# Patient Record
Sex: Female | Born: 1965 | Race: White | Hispanic: No | Marital: Married | Smoking: Former smoker
Health system: Southern US, Community
[De-identification: ages and names within clinical notes are randomized; demographics above are authoritative.]

## PROBLEM LIST (undated history)

## (undated) DIAGNOSIS — F329 Major depressive disorder, single episode, unspecified: Secondary | ICD-10-CM

## (undated) DIAGNOSIS — I10 Essential (primary) hypertension: Secondary | ICD-10-CM

## (undated) DIAGNOSIS — Z9889 Other specified postprocedural states: Secondary | ICD-10-CM

## (undated) DIAGNOSIS — F419 Anxiety disorder, unspecified: Secondary | ICD-10-CM

## (undated) DIAGNOSIS — F32A Depression, unspecified: Secondary | ICD-10-CM

## (undated) DIAGNOSIS — Z87442 Personal history of urinary calculi: Secondary | ICD-10-CM

## (undated) DIAGNOSIS — R519 Headache, unspecified: Secondary | ICD-10-CM

## (undated) DIAGNOSIS — Z8489 Family history of other specified conditions: Secondary | ICD-10-CM

## (undated) DIAGNOSIS — R51 Headache: Secondary | ICD-10-CM

## (undated) DIAGNOSIS — J309 Allergic rhinitis, unspecified: Secondary | ICD-10-CM

## (undated) DIAGNOSIS — G4700 Insomnia, unspecified: Secondary | ICD-10-CM

## (undated) DIAGNOSIS — R112 Nausea with vomiting, unspecified: Secondary | ICD-10-CM

## (undated) HISTORY — PX: ABDOMINAL HYSTERECTOMY: SHX81

## (undated) HISTORY — PX: DIAGNOSTIC LAPAROSCOPY: SUR761

---

## 1988-06-19 HISTORY — PX: REDUCTION MAMMAPLASTY: SUR839

## 2008-06-03 ENCOUNTER — Emergency Department: Payer: Self-pay | Admitting: Emergency Medicine

## 2008-06-05 ENCOUNTER — Ambulatory Visit: Payer: Self-pay | Admitting: Specialist

## 2009-05-15 ENCOUNTER — Emergency Department: Payer: Self-pay | Admitting: Emergency Medicine

## 2011-02-10 ENCOUNTER — Emergency Department: Payer: Self-pay | Admitting: Emergency Medicine

## 2012-10-01 ENCOUNTER — Ambulatory Visit: Payer: Self-pay | Admitting: Internal Medicine

## 2013-01-15 ENCOUNTER — Ambulatory Visit: Payer: Self-pay | Admitting: Neurology

## 2013-06-30 ENCOUNTER — Ambulatory Visit: Payer: Self-pay | Admitting: Obstetrics and Gynecology

## 2013-06-30 LAB — BASIC METABOLIC PANEL
Anion Gap: 4 — ABNORMAL LOW (ref 7–16)
BUN: 17 mg/dL (ref 7–18)
Calcium, Total: 9.4 mg/dL (ref 8.5–10.1)
Chloride: 103 mmol/L (ref 98–107)
Co2: 30 mmol/L (ref 21–32)
Creatinine: 0.72 mg/dL (ref 0.60–1.30)
EGFR (African American): >60
EGFR (Non-African Amer.): >60
GLUCOSE: 78 mg/dL (ref 65–99)
OSMOLALITY: 274 (ref 275–301)
Potassium: 4.2 mmol/L (ref 3.5–5.1)
Sodium: 137 mmol/L (ref 136–145)

## 2013-06-30 LAB — HEMOGLOBIN: HGB: 13 g/dL (ref 12.0–16.0)

## 2013-06-30 LAB — PREGNANCY, URINE: PREGNANCY TEST, URINE: NEGATIVE m[IU]/mL

## 2013-07-02 ENCOUNTER — Inpatient Hospital Stay: Payer: Self-pay | Admitting: Obstetrics and Gynecology

## 2013-07-03 LAB — HEMATOCRIT: HCT: 36 % (ref 35.0–47.0)

## 2013-07-03 LAB — BASIC METABOLIC PANEL
ANION GAP: 6 — AB (ref 7–16)
BUN: 15 mg/dL (ref 7–18)
CALCIUM: 8.5 mg/dL (ref 8.5–10.1)
CO2: 25 mmol/L (ref 21–32)
Chloride: 102 mmol/L (ref 98–107)
Creatinine: 0.67 mg/dL (ref 0.60–1.30)
EGFR (Non-African Amer.): >60
Glucose: 121 mg/dL — ABNORMAL HIGH (ref 65–99)
Osmolality: 268 (ref 275–301)
Potassium: 3.7 mmol/L (ref 3.5–5.1)
Sodium: 133 mmol/L — ABNORMAL LOW (ref 136–145)

## 2013-07-04 LAB — PATHOLOGY REPORT

## 2014-06-23 ENCOUNTER — Observation Stay: Payer: Self-pay | Admitting: Internal Medicine

## 2014-06-23 LAB — TROPONIN I: Troponin-I: <0.02 ng/mL

## 2014-06-23 LAB — CK TOTAL AND CKMB (NOT AT ARMC)
CK, TOTAL: 33 U/L (ref 26–192)
CK, TOTAL: 31 U/L (ref 26–192)
CK, Total: 46 U/L (ref 26–192)
CK-MB: 0.9 ng/mL (ref 0.5–3.6)
CK-MB: 0.9 ng/mL (ref 0.5–3.6)
CK-MB: 1.1 ng/mL (ref 0.5–3.6)

## 2014-06-23 LAB — COMPREHENSIVE METABOLIC PANEL
ALBUMIN: 3.5 g/dL (ref 3.4–5.0)
ALT: 26 U/L
AST: 17 U/L (ref 15–37)
Alkaline Phosphatase: 63 U/L
Anion Gap: 9 (ref 7–16)
BUN: 14 mg/dL (ref 7–18)
Bilirubin,Total: 0.3 mg/dL (ref 0.2–1.0)
CO2: 27 mmol/L (ref 21–32)
Calcium, Total: 9.1 mg/dL (ref 8.5–10.1)
Chloride: 100 mmol/L (ref 98–107)
Creatinine: 0.89 mg/dL (ref 0.60–1.30)
EGFR (African American): >60
EGFR (Non-African Amer.): >60
GLUCOSE: 91 mg/dL (ref 65–99)
Osmolality: 272 (ref 275–301)
Potassium: 4 mmol/L (ref 3.5–5.1)
SODIUM: 136 mmol/L (ref 136–145)
Total Protein: 7.2 g/dL (ref 6.4–8.2)

## 2014-06-23 LAB — CBC
HCT: 42.2 % (ref 35.0–47.0)
HGB: 14.1 g/dL (ref 12.0–16.0)
MCH: 29.1 pg (ref 26.0–34.0)
MCHC: 33.5 g/dL (ref 32.0–36.0)
MCV: 87 fL (ref 80–100)
Platelet: 340 10*3/uL (ref 150–440)
RBC: 4.86 10*6/uL (ref 3.80–5.20)
RDW: 13.2 % (ref 11.5–14.5)
WBC: 7.6 10*3/uL (ref 3.6–11.0)

## 2014-06-23 LAB — TSH: Thyroid Stimulating Horm: 0.921 u[IU]/mL

## 2014-06-23 LAB — PRO B NATRIURETIC PEPTIDE: B-Type Natriuretic Peptide: 51 pg/mL (ref 0–125)

## 2014-06-24 LAB — CBC WITH DIFFERENTIAL/PLATELET
Basophil #: 0.1 10*3/uL (ref 0.0–0.1)
Basophil %: 1 %
EOS ABS: 0.2 10*3/uL (ref 0.0–0.7)
Eosinophil %: 3.5 %
HCT: 37.8 % (ref 35.0–47.0)
HGB: 12.4 g/dL (ref 12.0–16.0)
LYMPHS PCT: 47.4 %
Lymphocyte #: 3 10*3/uL (ref 1.0–3.6)
MCH: 28.9 pg (ref 26.0–34.0)
MCHC: 32.7 g/dL (ref 32.0–36.0)
MCV: 88 fL (ref 80–100)
Monocyte #: 0.5 x10 3/mm (ref 0.2–0.9)
Monocyte %: 7.8 %
NEUTROS PCT: 40.3 %
Neutrophil #: 2.6 10*3/uL (ref 1.4–6.5)
Platelet: 286 10*3/uL (ref 150–440)
RBC: 4.28 10*6/uL (ref 3.80–5.20)
RDW: 13.5 % (ref 11.5–14.5)
WBC: 6.4 10*3/uL (ref 3.6–11.0)

## 2014-06-24 LAB — BASIC METABOLIC PANEL
ANION GAP: 6 — AB (ref 7–16)
BUN: 20 mg/dL — AB (ref 7–18)
CALCIUM: 8.4 mg/dL — AB (ref 8.5–10.1)
Chloride: 106 mmol/L (ref 98–107)
Co2: 25 mmol/L (ref 21–32)
Creatinine: 0.92 mg/dL (ref 0.60–1.30)
EGFR (African American): >60
EGFR (Non-African Amer.): >60
GLUCOSE: 92 mg/dL (ref 65–99)
OSMOLALITY: 276 (ref 275–301)
POTASSIUM: 3.8 mmol/L (ref 3.5–5.1)
Sodium: 137 mmol/L (ref 136–145)

## 2014-06-24 LAB — MAGNESIUM: MAGNESIUM: 1.9 mg/dL

## 2014-10-10 NOTE — Op Note (Signed)
PATIENT NAME:  Dana Stevens, Dana Stevens MR#:  [id number] DATE OF BIRTH:  [birth year]  DATE OF PROCEDURE:  07/02/2013  PREOPERATIVE DIAGNOSIS: Menorrhagia unresponsive to medical management.   POSTOPERATIVE DIAGNOSIS: Menorrhagia unresponsive to medical management.   PROCEDURE:  1.  Abdominal supracervical hysterectomy.  2.  Bilateral salpingectomies.   ANESTHESIA: General endotracheal anesthesia.  SURGEON: Boykin Nearing, M.D.   FIRST ASSISTANT: Dr. Amalia Hailey.   INDICATIONS: This is a 49 year old gravida 1, para 0 patient with a several month history of  menorrhagia unresponsive to medical treatment. The patient has elected for permanent surgical intervention   PROCEDURE: After adequate general endotracheal anesthesia, the patient was placed in the dorsal supine position, legs were in the [place] stirrups and SCDs were on the lower extremities. The patient received 2 grams IV cefoxitin prior to commencement of the case. The patient's abdomen and vagina were prepped and draped in normal sterile fashion. A Pfannenstiel incision was made 2 fingerbreadths above the symphysis pubis. Sharp dissection was used to identify the fascia. The patient had approximately 7 cm of subcutaneous fat before gaining entrance into the fascia. The fascia was opened in a transverse fashion and the superior aspect of the fascia was grasped with Kocher clamps and the recti muscles were dissected free. The inferior aspect of the fascia was grasped with Kocher clamps and the pyramidalis muscle was dissected free. Entry into the peritoneal cavity was accomplished sharply. O'Connor-O'Sullivan retractor was initially placed in the abdomen, but limited visibility based on the patient's body habitus required the Bookwalter retractor be brought up to the operative field. Ultimately, multiple retractors were used to gain visualization to the uterus. A small uterus was identified. Two large Kelly clamps were placed on the cornua of the uterus.  The round ligaments were bilaterally clamped, transected, and suture ligated with 0 Vicryl suture. A  window in the broad ligament was opened on both sides and Heaney clamps were used to clamp the utero-ovarian ligament. They were transected and suture ligated with 0 Vicryl suture.   The peritoneal fold over the bladder was opened and the bladder was reflected inferiorly. The uterine arteries were skeletonized and bilaterally clamped, transected, and suture ligated with 0 Vicryl suture. Given the poor visualization based on the patient's body habitus, it was elected at this point, not to try to proceed on with removal of the cervix. Therefore, a supracervical incision was made with removal of the uterus. The endocervical canal was then cauterized with the Bovie. Good hemostasis was noted. Each fallopian tube was grasped with a Babcock clamp and each fallopian tube was clamped with a Heaney clamp and removed and suture ligated with good hemostasis noted. The patient's abdomen was copiously irrigated. Good hemostasis was noted. All sponges were removed and retractors were removed. Fascia was closed with 0 Vicryl suture in a running nonlocking fashion with good approximation of edges. Subcutaneous tissues were reapproximated with 2-0 chromic suture given the depth of the subcutaneous tissue and the skin was reapproximated with staples. There were no complications.   ESTIMATED BLOOD LOSS: 150 mL.  INTRAOPERATIVE FLUIDS: 1300 mL.  URINE OUTPUT: 200 mL.  The patient tolerated the procedure well and was taken to the recovery room in good condition.   ____________________________ Boykin Nearing, MD tjs:aw D: 07/02/2013 09:55:22 ET T: 07/02/2013 10:01:57 ET JOB#: [id number]  cc: Boykin Nearing, MD, <Dictator> Boykin Nearing MD ELECTRONICALLY SIGNED 07/04/2013 8:37

## 2014-10-18 NOTE — Discharge Summary (Signed)
PATIENT NAME:  Dana Stevens, Dana Stevens MR#:  [id number] DATE OF BIRTH:  1966-05-26  DATE OF ADMISSION:  06/23/2014 DATE OF DISCHARGE:  06/24/2014  DISCHARGE DIAGNOSES:  1. Sinus bradycardia due to propranolol.  2. Migraine.  3. Hypertension.  4. Anxiety.   DISCHARGE MEDICATIONS:  1. Sudafed 30 mg 2 tablets every 6 hours as needed.  2. Advil 200 mg 2 tablets every 6 hours as needed.  3. Tylenol 325 mg oral every 4 hours as needed.  4. Zyrtec 10 mg oral once a day. 5. Flonase 1 spray nasal once a day.  6. Prilosec over-the-counter 20 mg daily.  7. Clonazepam 2 mg oral at bedtime.  8. Lisinopril 10 mg daily.  9. Hydrochlorothiazide 25 mg daily.  10. Meclizine 25 mg oral 2 times a day as needed.  11. Fluoxetine 20 mg daily.  12. Imitrex 50 mg oral once a day as needed for migraine.  13. Topamax 25 mg oral once a day for 1 week and then twice a day.   DISCHARGE INSTRUCTIONS: Low-sodium diet. Activity as tolerated. Follow up with Dr. Manuella Ghazi of neurology in 1 to 2 weeks for migraines.   IMAGING STUDIES: Include: 1. Echocardiogram which showed ejection fraction 55 to 60%, mild LVH, nothing acute.  2. Carotid Doppler showed no stenosis.   Chest x-ray normal.   ADMITTING HISTORY AND PHYSICAL AND HOSPITAL COURSE: Please see detailed H and P dictated previously by Dr. Tressia Miners.  In brief, a 49 year old patient with history of migraines on prophylactic propranolol for migraine and hypertension presented to the hospital complaining of 3 episodes of syncope, was found to have a heart rate in the 30s. EKG showed normal PR.  The patient's propranolol has been stopped during the hospital stay. Cardiac enzymes normal. Echocardiogram normal.  The patient's heart rate is presently between 55 to 65. The patient feels back to baseline and is being discharged back home after stopping the propranolol.  For her migraines, the patient is being started on Topamax low dose 25 mg which will be doubled to twice a day in 1  week and she will follow up with neurology as outpatient for her migraines.    Prior to discharge, the patient's lungs sound clear. S1, S2 heard. No bradycardia. No edema.   TIME SPENT ON DAY OF DISCHARGE IN DISCHARGE ACTIVITY: 45 minutes.     ____________________________ Dana Alf Anira Senegal, MD srs:by D: 06/25/2014 12:42:15 ET T: 06/25/2014 14:34:38 ET JOB#: [id number]  cc: Alveta Heimlich R. Adelin Ventrella, MD, <Dictator> Irven Easterly. Kary Kos, MD Hemang K. Manuella Ghazi, MD  Neita Carp MD ELECTRONICALLY SIGNED 07/03/2014 12:51

## 2014-10-18 NOTE — H&P (Signed)
PATIENT NAME:  Dana Stevens, Dana Stevens MR#:  [id number] DATE OF BIRTH:  Apr 14, 1966  DATE OF ADMISSION:  06/23/2014  ADMITTING PHYSICIAN: Gladstone Lighter, MD  PRIMARY CARE PHYSICIAN: Irven Easterly. Kary Kos, MD    CHIEF COMPLAINT: Passing out.   HISTORY OF PRESENT ILLNESS: Dana Stevens is an obese 49 year old pleasant Caucasian female with past medical history significant for hypertension, depression, anxiety, and migraine headaches, brought from work secondary to 3 weakness, passing out episodes with loss of consciousness. The patient states that she has been feeling fine when she woke up this morning. Around 8:30, she felt having cold sweats, dizziness and before she realized, she passed out. Did not hit her head. No weakness, seizure activity. She said she might have been out for only a few seconds and the same episode repeated about 2 more times. She did have some headaches and dizziness. No chest pain, but some difficulty breathing. In the Emergency Room, the patient had another episode of syncope for a few seconds. She was noted to be bradycardic in the high 30s. She is on propranolol for her migraine prophylaxis. She had been on 80 mg twice a day about a year ago, but she was changed over to 80 mg once a day about 10 months ago due to a syncopal episode. Denies any prior cardiac history or family history of heart disease present with grandmother with pacemaker. No prior syncopal episodes, other than the one in the remote past, when propranolol twice a day was started.   PAST MEDICAL HISTORY:  1.  Migraine headaches.  2.  Hypertension.  3.  Depression and anxiety.   PAST SURGICAL HISTORY:  1.  Breast reduction surgery.  2.  Abdominal hysterectomy and salpingectomy.   ALLERGIES TO MEDICATIONS:  1.  Cipro.  2.  Levaquin. 3.  Steroids.  4.  Peanuts. 5.  Latex. 6.  Smoke.  7.  Cologne and perfume.  8.  Adhesive material.   CURRENT MEDICATIONS: 1.  Propranolol 80 mg p.o. once a day at bedtime.  2.   Advil 200 mg p.o. q. 6 hours p.r.n.  3.  Klonopin 2 mg p.o. at bedtime.  4.  Flonase 50 mcg nasal spray once a day as needed.  5.  Fluoxetine 20 mg p.o. daily.  6.  Hydrochlorothiazide 25 mg p.o. daily.  7.  Lisinopril 10 mg p.o. daily.  8.  Meclizine 25 mg twice a day as needed.  9.  Prilosec over the counter 20 mg oral daily.  10.  Sudafed 30 mg 2 tablets q. 6 hours p.r.n. for recent sinus infection.  11.  Tylenol 650 mg q. 4 hours p.r.n. for pain or fever.  12.  Zyrtec 10 mg p.o. daily.   SOCIAL HISTORY: Lives at home with her husband. No smoking or alcohol use. Works as a Insurance claims handler.   FAMILY HISTORY: Both grandfathers with heart disease. Grandmother had pacemaker. Mom died from breast cancer.   REVIEW OF SYSTEMS:  CONSTITUTIONAL: No fever, fatigue, or weakness.  EYES: No blurred vision, double vision, inflammation, or glaucoma.  ENT: No tinnitus, ear pain, hearing loss, epistaxis or discharge.   RESPIRATORY: No cough, wheeze, hemoptysis, or COPD. CARDIOVASCULAR: No chest pain, orthopnea, edema, arrhythmia, or palpitations. Positive for syncope.  GASTROINTESTINAL: No nausea, vomiting, diarrhea, abdominal pain, hematemesis, or melena.  GENITOURINARY: No dysuria, hematuria, renal calculus, frequency, or incontinence.  ENDOCRINE: No polyuria, nocturia, thyroid problems, heat or cold intolerance.  HEMATOLOGY: No anemia, easy bruising, or bleeding.  SKIN: No acne,  rash or lesions. MUSCULOSKELETAL: No neck fracture repair, nodularities, or gout.  NEUROLOGIC: No numbness, weakness, CVA, TIA, or seizures.  PSYCHOLOGICAL: No anxiety, insomnia, or depression.   PHYSICAL EXAMINATION: VITAL SIGNS: Temperature 97.5 degrees Fahrenheit, pulse 51, respirations 12, blood pressure 133/68, pulse ox 97% on room air.  GENERAL: Heavily built, well-nourished female lying in bed, not in any acute distress.  HEENT: Normocephalic, atraumatic. Pupils equal, round, reacting to light. Anicteric  sclerae. Extraocular intact. Oropharynx is clear without erythema, mass, or exudates.  NECK: Supple. No thyromegaly, JVD, or carotid bruits. No lymphadenopathy.  LUNGS: Moving air bilaterally. No wheeze or crackles. No use of accessory muscles for breathing.  CARDIOVASCULAR: S1, S2, regular rate and rhythm. No murmurs, rubs, or gallops.  ABDOMEN: Obese, soft, nontender, nondistended, no hepatosplenomegaly, normal bowel sounds.  EXTREMITIES: No pedal edema. No clubbing or cyanosis, 1+ dorsalis pedis pulses palpable bilaterally.  SKIN: No acne, rash, or lesions.  LYMPHATICS: No cervical or inguinal lymphadenopathy.  NEUROLOGIC: Cranial nerves intact. No focal motor or sensory deficits.  PSYCHOLOGICAL: The patient is awake, alert, oriented x3.   LABORATORY DATA: WBC 7.6, hemoglobin 14.1, hematocrit 42.2, platelet count 340. Sodium 136, potassium 4.0, chloride 100, bicarbonate 27, BUN 14, creatinine 0.89, glucose of 91, calcium of 9.1. ALT 26, AST 17 alkaline phosphatase is 63, albumin of 3.3, total bilirubin 0.3. Troponin, the 1st one is negative. BNP is within normal limits at 51. Chest x-ray: Clear lung fields. No acute cardiopulmonary disease. EKG: Showing sinus bradycardia, heart rate of 44. No heart blocks noted.   ASSESSMENT AND PLAN: This is a 49 year old female with history of migraines, hypertension, depression, and anxiety, admitted for recurrent syncope.  1.  Syncope, could be secondary to bradycardia, has been sinus brady with no heart blocks. We will monitor on telemetry unit. Get an echo and cardiology consult. Get carotid Dopplers, as well. Hold propranolol, as she has been on that, which could be the culprit medication. Check TSH and, recycle cardiac markers.  2.  Hypertension. Continue her lisinopril. Follow head CT scan, propranolol.  3.  Depression and anxiety. Continue fluoxetine and Klonopin.  4.  Deep vein thrombosis prophylaxis.   CODE STATUS: Full code.   TIME SPENT ON  ADMISSION: 50 minutes.    ____________________________ Gladstone Lighter, MD rk:mw D: 06/23/2014 12:41:51 ET T: 06/23/2014 12:58:02 ET JOB#: [id number]  cc: Gladstone Lighter, MD, <Dictator> Irven Easterly. Kary Kos, MD Gladstone Lighter MD ELECTRONICALLY SIGNED 07/08/2014 13:25

## 2014-12-08 ENCOUNTER — Other Ambulatory Visit: Payer: Self-pay | Admitting: Family Medicine

## 2014-12-08 DIAGNOSIS — Z1231 Encounter for screening mammogram for malignant neoplasm of breast: Secondary | ICD-10-CM

## 2014-12-16 ENCOUNTER — Ambulatory Visit
Admission: RE | Admit: 2014-12-16 | Discharge: 2014-12-16 | Disposition: A | Discharge status: Home / Self Care | Payer: 59 | Source: Ambulatory Visit | Attending: Family Medicine | Admitting: Family Medicine

## 2014-12-16 DIAGNOSIS — Z1231 Encounter for screening mammogram for malignant neoplasm of breast: Secondary | ICD-10-CM | POA: Insufficient documentation

## 2016-06-30 ENCOUNTER — Other Ambulatory Visit: Payer: Self-pay | Admitting: Family Medicine

## 2016-06-30 DIAGNOSIS — Z1231 Encounter for screening mammogram for malignant neoplasm of breast: Secondary | ICD-10-CM

## 2016-08-03 ENCOUNTER — Ambulatory Visit
Admission: RE | Admit: 2016-08-03 | Discharge: 2016-08-03 | Disposition: A | Discharge status: Home / Self Care | Payer: BLUE CROSS/BLUE SHIELD | Source: Ambulatory Visit | Attending: Family Medicine | Admitting: Family Medicine

## 2016-08-03 DIAGNOSIS — Z1231 Encounter for screening mammogram for malignant neoplasm of breast: Secondary | ICD-10-CM | POA: Diagnosis present

## 2017-01-01 ENCOUNTER — Ambulatory Visit: Payer: BLUE CROSS/BLUE SHIELD | Admitting: Anesthesiology

## 2017-01-01 ENCOUNTER — Encounter
Admission: RE | Disposition: A | Discharge status: Home / Self Care | Payer: Self-pay | Source: Ambulatory Visit | Attending: Unknown Physician Specialty

## 2017-01-01 ENCOUNTER — Encounter: Payer: Self-pay | Admitting: *Deleted

## 2017-01-01 ENCOUNTER — Ambulatory Visit
Admission: RE | Admit: 2017-01-01 | Discharge: 2017-01-01 | Disposition: A | Discharge status: Home / Self Care | Payer: BLUE CROSS/BLUE SHIELD | Source: Ambulatory Visit | Attending: Unknown Physician Specialty | Admitting: Unknown Physician Specialty

## 2017-01-01 DIAGNOSIS — I10 Essential (primary) hypertension: Secondary | ICD-10-CM | POA: Diagnosis not present

## 2017-01-01 DIAGNOSIS — J309 Allergic rhinitis, unspecified: Secondary | ICD-10-CM | POA: Diagnosis not present

## 2017-01-01 DIAGNOSIS — Z7951 Long term (current) use of inhaled steroids: Secondary | ICD-10-CM | POA: Diagnosis not present

## 2017-01-01 DIAGNOSIS — F329 Major depressive disorder, single episode, unspecified: Secondary | ICD-10-CM | POA: Diagnosis not present

## 2017-01-01 DIAGNOSIS — Z1211 Encounter for screening for malignant neoplasm of colon: Secondary | ICD-10-CM | POA: Insufficient documentation

## 2017-01-01 DIAGNOSIS — Z87891 Personal history of nicotine dependence: Secondary | ICD-10-CM | POA: Insufficient documentation

## 2017-01-01 DIAGNOSIS — Z79899 Other long term (current) drug therapy: Secondary | ICD-10-CM | POA: Insufficient documentation

## 2017-01-01 DIAGNOSIS — K219 Gastro-esophageal reflux disease without esophagitis: Secondary | ICD-10-CM | POA: Diagnosis not present

## 2017-01-01 DIAGNOSIS — D125 Benign neoplasm of sigmoid colon: Secondary | ICD-10-CM | POA: Insufficient documentation

## 2017-01-01 DIAGNOSIS — K640 First degree hemorrhoids: Secondary | ICD-10-CM | POA: Diagnosis not present

## 2017-01-01 DIAGNOSIS — F419 Anxiety disorder, unspecified: Secondary | ICD-10-CM | POA: Insufficient documentation

## 2017-01-01 HISTORY — DX: Headache: R51

## 2017-01-01 HISTORY — DX: Family history of other specified conditions: Z84.89

## 2017-01-01 HISTORY — DX: Anxiety disorder, unspecified: F41.9

## 2017-01-01 HISTORY — DX: Other specified postprocedural states: Z98.890

## 2017-01-01 HISTORY — DX: Headache, unspecified: R51.9

## 2017-01-01 HISTORY — PX: COLONOSCOPY WITH PROPOFOL: SHX5780

## 2017-01-01 HISTORY — DX: Depression, unspecified: F32.A

## 2017-01-01 HISTORY — DX: Allergic rhinitis, unspecified: J30.9

## 2017-01-01 HISTORY — DX: Essential (primary) hypertension: I10

## 2017-01-01 HISTORY — DX: Nausea with vomiting, unspecified: R11.2

## 2017-01-01 HISTORY — DX: Insomnia, unspecified: G47.00

## 2017-01-01 HISTORY — DX: Major depressive disorder, single episode, unspecified: F32.9

## 2017-01-01 HISTORY — DX: Personal history of urinary calculi: Z87.442

## 2017-01-01 SURGERY — COLONOSCOPY WITH PROPOFOL
Anesthesia: General

## 2017-01-01 MED ORDER — PENTAFLUOROPROP-TETRAFLUOROETH EX AERO
INHALATION_SPRAY | CUTANEOUS | Status: AC
  Filled 2017-01-01: qty 30

## 2017-01-01 MED ORDER — LIDOCAINE 2% (20 MG/ML) 5 ML SYRINGE
INTRAMUSCULAR | Status: DC | PRN
  Administered 2017-01-01: 100 mg via INTRAVENOUS

## 2017-01-01 MED ORDER — SODIUM CHLORIDE 0.9 % IV SOLN: INTRAVENOUS | Status: DC

## 2017-01-01 MED ORDER — LIDOCAINE HCL (PF) 2 % IJ SOLN
INTRAMUSCULAR | Status: AC
  Filled 2017-01-01: qty 2

## 2017-01-01 MED ORDER — SODIUM CHLORIDE 0.9 % IV SOLN
INTRAVENOUS | Status: DC
  Administered 2017-01-01: 1000 mL via INTRAVENOUS

## 2017-01-01 MED ORDER — PROPOFOL 10 MG/ML IV BOLUS
INTRAVENOUS | Status: DC | PRN
  Administered 2017-01-01: 80 mg via INTRAVENOUS

## 2017-01-01 MED ORDER — PROPOFOL 500 MG/50ML IV EMUL
INTRAVENOUS | Status: DC | PRN
  Administered 2017-01-01: 200 ug/kg/min via INTRAVENOUS

## 2017-01-01 MED ORDER — PROPOFOL 500 MG/50ML IV EMUL
INTRAVENOUS | Status: AC
  Filled 2017-01-01: qty 50
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
## 2017-01-01 NOTE — Anesthesia Post-op Follow-up Note (Cosign Needed)
Anesthesia QCDR form completed.        
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
## 2017-01-01 NOTE — Op Note (Signed)
[facility] Patient Name: Dana Stevens Procedure Date: 01/01/2017 3:42 PM MRN: [id number] Account #: [id number] Date of Birth: 12/17/1965 Admit Type: Outpatient Age: 51 Room: [facility] ENDO ROOM 3 Gender: Female Note Status: Finalized Procedure:            Colonoscopy Indications:          Screening for colorectal malignant neoplasm Providers:            Manya Silvas, MD Referring MD:         Irven Easterly. Kary Kos, MD (Referring MD) Medicines:            Propofol per Anesthesia Complications:        No immediate complications. Procedure:            Pre-Anesthesia Assessment:                       - After reviewing the risks and benefits, the patient                        was deemed in satisfactory condition to undergo the                        procedure.                       After obtaining informed consent, the colonoscope was                        passed under direct vision. Throughout the procedure,                        the patient's blood pressure, pulse, and oxygen                        saturations were monitored continuously. The                        Colonoscope was introduced through the anus and                        advanced to the the cecum, identified by appendiceal                        orifice and ileocecal valve. The colonoscopy was                        performed without difficulty. The patient tolerated the                        procedure well. The quality of the bowel preparation                        was good. Findings:      A diminutive polyp was found in the distal sigmoid colon. The polyp was       sessile. The polyp was removed with a jumbo cold forceps. Resection and       retrieval were complete.      Internal hemorrhoids were found during endoscopy. The hemorrhoids were       small and Grade I (internal hemorrhoids that do not prolapse).      The  exam was otherwise without abnormality. Impression:           -  One diminutive polyp in the distal sigmoid colon,                        removed with a jumbo cold forceps. Resected and                        retrieved.                       - Internal hemorrhoids.                       - The examination was otherwise normal. Recommendation:       - Await pathology results. Procedure Code(s):    --- Professional ---                       [phone], Colonoscopy, flexible; with biopsy, single or                        multiple Diagnosis Code(s):    --- Professional ---                       Z12.11, Encounter for screening for malignant neoplasm                        of colon                       D12.5, Benign neoplasm of sigmoid colon                       K64.0, First degree hemorrhoids CPT copyright 2016 American Medical Association. All rights reserved. The codes documented in this report are preliminary and upon coder review may  be revised to meet current compliance requirements. Manya Silvas, MD 01/01/2017 4:04:33 PM This report has been signed electronically. Number of Addenda: 0 Note Initiated On: 01/01/2017 3:42 PM Scope Withdrawal Time: 0 hours 10 minutes 53 seconds  Total Procedure Duration: 0 hours 14 minutes 6 seconds       [facility]

## 2017-01-01 NOTE — Transfer of Care (Signed)
Immediate Anesthesia Transfer of Care Note  Patient: Dana Stevens  Procedure(s) Performed: Procedure(s): COLONOSCOPY WITH PROPOFOL (N/A)  Patient Location: Endoscopy Unit  Anesthesia Type:General  Level of Consciousness: awake, alert  and oriented  Airway & Oxygen Therapy: Patient connected to nasal cannula oxygen  Post-op Assessment: Post -op Vital signs reviewed and stable  Post vital signs: stable  Last Vitals:  Vitals:   01/01/17 1440 01/01/17 1609  BP: 111/66   Pulse: (!) 52 64  Resp: 18 19  Temp: 37.2 C     Last Pain:  Vitals:   01/01/17 1440  TempSrc: Tympanic  PainSc: 5       Patients Stated Pain Goal: 0 (92/17/83 7542)  Complications: No apparent anesthesia complications

## 2017-01-01 NOTE — Anesthesia Postprocedure Evaluation (Signed)
Anesthesia Post Note  Patient: Dana Stevens  Procedure(s) Performed: Procedure(s) (LRB): COLONOSCOPY WITH PROPOFOL (N/A)  Patient location during evaluation: Endoscopy Anesthesia Type: General Level of consciousness: awake and alert Pain management: pain level controlled Vital Signs Assessment: post-procedure vital signs reviewed and stable Respiratory status: spontaneous breathing, nonlabored ventilation, respiratory function stable and patient connected to nasal cannula oxygen Cardiovascular status: blood pressure returned to baseline and stable Postop Assessment: no signs of nausea or vomiting Anesthetic complications: no     Last Vitals:  Vitals:   01/01/17 1629 01/01/17 1639  BP: 129/76 132/84  Pulse: (!) 49 (!) 48  Resp: 18 14  Temp:      Last Pain:  Vitals:   01/01/17 1609  TempSrc: Tympanic  PainSc:                  [name]

## 2017-01-02 ENCOUNTER — Encounter: Payer: Self-pay | Admitting: Unknown Physician Specialty

## 2017-01-03 LAB — SURGICAL PATHOLOGY

## 2017-10-04 ENCOUNTER — Other Ambulatory Visit: Payer: Self-pay | Admitting: Family Medicine

## 2017-10-04 DIAGNOSIS — Z1231 Encounter for screening mammogram for malignant neoplasm of breast: Secondary | ICD-10-CM

## 2017-10-18 ENCOUNTER — Ambulatory Visit
Admission: RE | Admit: 2017-10-18 | Discharge: 2017-10-18 | Disposition: A | Discharge status: Home / Self Care | Payer: BLUE CROSS/BLUE SHIELD | Source: Ambulatory Visit | Attending: Family Medicine | Admitting: Family Medicine

## 2017-10-18 DIAGNOSIS — Z1231 Encounter for screening mammogram for malignant neoplasm of breast: Secondary | ICD-10-CM | POA: Diagnosis present

## 2018-03-24 ENCOUNTER — Emergency Department (HOSPITAL_COMMUNITY): Payer: BLUE CROSS/BLUE SHIELD

## 2018-03-24 ENCOUNTER — Emergency Department (HOSPITAL_COMMUNITY)
Admission: EM | Admit: 2018-03-24 | Discharge: 2018-03-24 | Disposition: A | Discharge status: Home / Self Care | Payer: BLUE CROSS/BLUE SHIELD | Attending: Emergency Medicine | Admitting: Emergency Medicine

## 2018-03-24 DIAGNOSIS — I10 Essential (primary) hypertension: Secondary | ICD-10-CM | POA: Diagnosis not present

## 2018-03-24 DIAGNOSIS — S0990XA Unspecified injury of head, initial encounter: Secondary | ICD-10-CM | POA: Insufficient documentation

## 2018-03-24 DIAGNOSIS — Y92410 Unspecified street and highway as the place of occurrence of the external cause: Secondary | ICD-10-CM | POA: Insufficient documentation

## 2018-03-24 DIAGNOSIS — Y998 Other external cause status: Secondary | ICD-10-CM | POA: Insufficient documentation

## 2018-03-24 DIAGNOSIS — Z91040 Latex allergy status: Secondary | ICD-10-CM | POA: Insufficient documentation

## 2018-03-24 DIAGNOSIS — R110 Nausea: Secondary | ICD-10-CM | POA: Diagnosis not present

## 2018-03-24 DIAGNOSIS — Y9389 Activity, other specified: Secondary | ICD-10-CM | POA: Diagnosis not present

## 2018-03-24 DIAGNOSIS — Z79899 Other long term (current) drug therapy: Secondary | ICD-10-CM | POA: Diagnosis not present

## 2018-03-24 DIAGNOSIS — Z87891 Personal history of nicotine dependence: Secondary | ICD-10-CM | POA: Insufficient documentation

## 2018-03-24 DIAGNOSIS — R1030 Lower abdominal pain, unspecified: Secondary | ICD-10-CM | POA: Diagnosis not present

## 2018-03-24 DIAGNOSIS — T1490XA Injury, unspecified, initial encounter: Secondary | ICD-10-CM

## 2018-03-24 LAB — I-STAT CHEM 8, ED
BUN: 17 mg/dL (ref 6–20)
CREATININE: 0.8 mg/dL (ref 0.44–1.00)
Calcium, Ion: 1.09 mmol/L — ABNORMAL LOW (ref 1.15–1.40)
Chloride: 108 mmol/L (ref 98–111)
Glucose, Bld: 95 mg/dL (ref 70–99)
HCT: 40 % (ref 36.0–46.0)
HEMOGLOBIN: 13.6 g/dL (ref 12.0–15.0)
Potassium: 3.4 mmol/L — ABNORMAL LOW (ref 3.5–5.1)
Sodium: 139 mmol/L (ref 135–145)
TCO2: 22 mmol/L (ref 22–32)

## 2018-03-24 LAB — COMPREHENSIVE METABOLIC PANEL
ALT: 16 U/L (ref 0–44)
AST: 16 U/L (ref 15–41)
Albumin: 3.5 g/dL (ref 3.5–5.0)
Alkaline Phosphatase: 46 U/L (ref 38–126)
Anion gap: 9 (ref 5–15)
BILIRUBIN TOTAL: 0.5 mg/dL (ref 0.3–1.2)
BUN: 16 mg/dL (ref 6–20)
CHLORIDE: 105 mmol/L (ref 98–111)
CO2: 20 mmol/L — ABNORMAL LOW (ref 22–32)
CREATININE: 0.85 mg/dL (ref 0.44–1.00)
Calcium: 8.9 mg/dL (ref 8.9–10.3)
GFR calc Af Amer: >60 mL/min (ref >60)
Glucose, Bld: 99 mg/dL (ref 70–99)
Potassium: 3.2 mmol/L — ABNORMAL LOW (ref 3.5–5.1)
Sodium: 134 mmol/L — ABNORMAL LOW (ref 135–145)
TOTAL PROTEIN: 6.2 g/dL — AB (ref 6.5–8.1)

## 2018-03-24 LAB — CBC
HCT: 40.5 % (ref 36.0–46.0)
Hemoglobin: 13.2 g/dL (ref 12.0–15.0)
MCH: 29.7 pg (ref 26.0–34.0)
MCHC: 32.6 g/dL (ref 30.0–36.0)
MCV: 91 fL (ref 78.0–100.0)
PLATELETS: 331 10*3/uL (ref 150–400)
RBC: 4.45 MIL/uL (ref 3.87–5.11)
RDW: 12.5 % (ref 11.5–15.5)
WBC: 10.7 10*3/uL — AB (ref 4.0–10.5)

## 2018-03-24 LAB — PROTIME-INR
INR: 1.01
Prothrombin Time: 13.2 seconds (ref 11.4–15.2)

## 2018-03-24 LAB — CDS SEROLOGY

## 2018-03-24 LAB — I-STAT CG4 LACTIC ACID, ED: Lactic Acid, Venous: 1.4 mmol/L (ref 0.5–1.9)

## 2018-03-24 LAB — SAMPLE TO BLOOD BANK

## 2018-03-24 LAB — ETHANOL: Alcohol, Ethyl (B): <10 mg/dL (ref <10)

## 2018-03-24 MED ORDER — IOHEXOL 300 MG/ML  SOLN
100.0000 mL | Freq: Once | INTRAMUSCULAR | Status: AC | PRN
  Administered 2018-03-24: 100 mL via INTRAVENOUS

## 2018-03-24 MED ORDER — ONDANSETRON HCL 4 MG/2ML IJ SOLN
4.0000 mg | Freq: Once | INTRAMUSCULAR | Status: DC
  Filled 2018-03-24: qty 2
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
## 2018-03-24 NOTE — ED Notes (Signed)
Patient transported to CT 

## 2018-03-24 NOTE — Discharge Instructions (Addendum)
Take tylenol or motrin as needed for pain. Follow up with primary care doctor in 2 days.  Return to the ED for any worsening or other concerns.  You will likely be more sore in the next 48-72 hours.  Incidentals found on CT scan: Left ovarian cystic lesion that requires pelvic ultrasound in 6 to 12 months for follow-up and oval hypodense hepatic lesion likely cystic vs hemangioma.  Follow-up with primary care doctor regarding these results.
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
## 2018-06-14 ENCOUNTER — Other Ambulatory Visit: Payer: Self-pay

## 2018-06-14 ENCOUNTER — Emergency Department
Admission: EM | Admit: 2018-06-14 | Discharge: 2018-06-14 | Disposition: A | Discharge status: Home / Self Care | Payer: BLUE CROSS/BLUE SHIELD | Attending: Emergency Medicine | Admitting: Emergency Medicine

## 2018-06-14 ENCOUNTER — Encounter: Payer: Self-pay | Admitting: Emergency Medicine

## 2018-06-14 DIAGNOSIS — Z87891 Personal history of nicotine dependence: Secondary | ICD-10-CM | POA: Diagnosis not present

## 2018-06-14 DIAGNOSIS — Z91040 Latex allergy status: Secondary | ICD-10-CM | POA: Diagnosis not present

## 2018-06-14 DIAGNOSIS — I10 Essential (primary) hypertension: Secondary | ICD-10-CM | POA: Diagnosis not present

## 2018-06-14 DIAGNOSIS — L02215 Cutaneous abscess of perineum: Secondary | ICD-10-CM | POA: Insufficient documentation

## 2018-06-14 DIAGNOSIS — K6289 Other specified diseases of anus and rectum: Secondary | ICD-10-CM | POA: Diagnosis present

## 2018-06-14 DIAGNOSIS — L0291 Cutaneous abscess, unspecified: Secondary | ICD-10-CM

## 2018-06-14 MED ORDER — LIDOCAINE-EPINEPHRINE 1 %-1:100000 IJ SOLN
10.0000 mL | Freq: Once | INTRAMUSCULAR | Status: DC
  Filled 2018-06-14 (×2): qty 10

## 2018-06-14 MED ORDER — OXYCODONE-ACETAMINOPHEN 5-325 MG PO TABS: 1.0000 | ORAL_TABLET | Freq: Four times a day (QID) | ORAL | 0 refills | Status: DC | PRN

## 2018-06-14 MED ORDER — MUPIROCIN 2 % EX OINT: TOPICAL_OINTMENT | CUTANEOUS | 0 refills | Status: AC

## 2018-06-14 MED ORDER — SULFAMETHOXAZOLE-TRIMETHOPRIM 800-160 MG PO TABS: 1.0000 | ORAL_TABLET | Freq: Two times a day (BID) | ORAL | 0 refills | Status: DC

## 2018-06-14 MED ORDER — LIDOCAINE-EPINEPHRINE 2 %-1:100000 IJ SOLN
10.0000 mL | Freq: Once | INTRAMUSCULAR | Status: AC
  Administered 2018-06-14: 10 mL via INTRADERMAL

## 2018-06-14 MED ORDER — OXYCODONE-ACETAMINOPHEN 5-325 MG PO TABS
2.0000 | ORAL_TABLET | Freq: Once | ORAL | Status: AC
  Administered 2018-06-14: 2 via ORAL
  Filled 2018-06-14: qty 2

## 2018-06-14 NOTE — ED Notes (Signed)
Patient placed on pelvic bed per her request. Suture cart at bedside.
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
## 2018-06-14 NOTE — ED Notes (Signed)
Patient c/o rectal pain described as pressure beginning Monday. Patient seen by [facility] clinic yesterday for same, had rectal exam performed, diagnosed with Hemorid. Patient does not believe diagnosis and thinks instead she has a cyst. Patient reports hx of cyst in the past.   Patient denies discharge.   Patient reports she has used prescribed medications, Tucks, and put ice into her rectal area with no relief of symptoms.

## 2018-06-14 NOTE — ED Notes (Signed)
This RN present as witness for I&D of perirectal abscess. Patient tolerated procedure well

## 2018-06-14 NOTE — ED Triage Notes (Addendum)
Patient ambulatory to triage with steady gait, without difficulty or distress noted; pt reports ?hemorrhoidal pain x 4-5 days; pt st no hx hemorrhoids and is unsure that is really the cause as the pain is going into buttocks as well; st seen at [facility] yesterday and rx lidocaine jelly and naprosyn without relief

## 2020-02-09 ENCOUNTER — Emergency Department: Payer: 59

## 2020-02-09 ENCOUNTER — Other Ambulatory Visit: Payer: Self-pay

## 2020-02-09 ENCOUNTER — Encounter: Payer: Self-pay | Admitting: Emergency Medicine

## 2020-02-09 DIAGNOSIS — Z87891 Personal history of nicotine dependence: Secondary | ICD-10-CM | POA: Insufficient documentation

## 2020-02-09 DIAGNOSIS — N83291 Other ovarian cyst, right side: Secondary | ICD-10-CM | POA: Diagnosis not present

## 2020-02-09 DIAGNOSIS — R109 Unspecified abdominal pain: Secondary | ICD-10-CM | POA: Diagnosis present

## 2020-02-09 DIAGNOSIS — R110 Nausea: Secondary | ICD-10-CM | POA: Diagnosis not present

## 2020-02-09 DIAGNOSIS — Z91040 Latex allergy status: Secondary | ICD-10-CM | POA: Insufficient documentation

## 2020-02-09 DIAGNOSIS — I10 Essential (primary) hypertension: Secondary | ICD-10-CM | POA: Diagnosis not present

## 2020-02-09 DIAGNOSIS — R1031 Right lower quadrant pain: Secondary | ICD-10-CM | POA: Insufficient documentation

## 2020-02-09 LAB — LIPASE, BLOOD: Lipase: 27 U/L (ref 11–51)

## 2020-02-09 LAB — COMPREHENSIVE METABOLIC PANEL
ALT: 13 U/L (ref 0–44)
AST: 11 U/L — ABNORMAL LOW (ref 15–41)
Albumin: 4.1 g/dL (ref 3.5–5.0)
Alkaline Phosphatase: 44 U/L (ref 38–126)
Anion gap: 11 (ref 5–15)
BUN: 18 mg/dL (ref 6–20)
CO2: 24 mmol/L (ref 22–32)
Calcium: 9.4 mg/dL (ref 8.9–10.3)
Chloride: 104 mmol/L (ref 98–111)
Creatinine, Ser: 0.64 mg/dL (ref 0.44–1.00)
GFR calc Af Amer: >60 mL/min (ref >60)
GFR calc non Af Amer: >60 mL/min (ref >60)
Glucose, Bld: 83 mg/dL (ref 70–99)
Potassium: 3.6 mmol/L (ref 3.5–5.1)
Sodium: 139 mmol/L (ref 135–145)
Total Bilirubin: 0.6 mg/dL (ref 0.3–1.2)
Total Protein: 7.1 g/dL (ref 6.5–8.1)

## 2020-02-09 LAB — CBC WITH DIFFERENTIAL/PLATELET
Abs Immature Granulocytes: 0.03 10*3/uL (ref 0.00–0.07)
Basophils Absolute: 0.1 10*3/uL (ref 0.0–0.1)
Basophils Relative: 1 %
Eosinophils Absolute: 0.4 10*3/uL (ref 0.0–0.5)
Eosinophils Relative: 5 %
HCT: 40.2 % (ref 36.0–46.0)
Hemoglobin: 13.6 g/dL (ref 12.0–15.0)
Immature Granulocytes: 0 %
Lymphocytes Relative: 34 %
Lymphs Abs: 3.3 10*3/uL (ref 0.7–4.0)
MCH: 30 pg (ref 26.0–34.0)
MCHC: 33.8 g/dL (ref 30.0–36.0)
MCV: 88.5 fL (ref 80.0–100.0)
Monocytes Absolute: 0.6 10*3/uL (ref 0.1–1.0)
Monocytes Relative: 6 %
Neutro Abs: 5.3 10*3/uL (ref 1.7–7.7)
Neutrophils Relative %: 54 %
Platelets: 323 10*3/uL (ref 150–400)
RBC: 4.54 MIL/uL (ref 3.87–5.11)
RDW: 12.5 % (ref 11.5–15.5)
WBC: 9.7 10*3/uL (ref 4.0–10.5)
nRBC: 0 % (ref 0.0–0.2)

## 2020-02-09 MED ORDER — IOHEXOL 300 MG/ML  SOLN
125.0000 mL | Freq: Once | INTRAMUSCULAR | Status: AC | PRN
  Administered 2020-02-09: 125 mL via INTRAVENOUS

## 2020-02-09 NOTE — ED Triage Notes (Signed)
Patient presents to the ED with right lower quadrant pain x 2 days that has been increasing.  Patient denies fever and reports nausea, denies vomiting.  Patient states she was sent from [facility] for possible appendicitis.  Patient appears uncomfortable.

## 2020-02-09 NOTE — ED Notes (Signed)
First Nurse Note:  Pt brought over from [facility]; sent over for rule out Appendicitis.  NAD noted at this time.

## 2020-02-10 ENCOUNTER — Emergency Department
Admission: EM | Admit: 2020-02-10 | Discharge: 2020-02-10 | Disposition: A | Discharge status: Home / Self Care | Payer: 59 | Attending: Emergency Medicine | Admitting: Emergency Medicine

## 2020-02-10 DIAGNOSIS — N83201 Unspecified ovarian cyst, right side: Secondary | ICD-10-CM

## 2020-02-10 DIAGNOSIS — R1031 Right lower quadrant pain: Secondary | ICD-10-CM

## 2020-02-10 MED ORDER — ONDANSETRON 4 MG PO TBDP
4.0000 mg | ORAL_TABLET | Freq: Once | ORAL | Status: AC
  Administered 2020-02-10: 4 mg via ORAL
  Filled 2020-02-10: qty 1

## 2020-02-10 MED ORDER — OXYCODONE-ACETAMINOPHEN 5-325 MG PO TABS: 1.0000 | ORAL_TABLET | ORAL | 0 refills | Status: DC | PRN

## 2020-02-10 MED ORDER — OXYCODONE-ACETAMINOPHEN 5-325 MG PO TABS
1.0000 | ORAL_TABLET | Freq: Once | ORAL | Status: AC
  Administered 2020-02-10: 1 via ORAL
  Filled 2020-02-10: qty 1

## 2020-02-10 MED ORDER — ONDANSETRON 4 MG PO TBDP: 4.0000 mg | ORAL_TABLET | Freq: Three times a day (TID) | ORAL | 0 refills | Status: AC | PRN

## 2020-02-10 NOTE — Discharge Instructions (Signed)
Please schedule follow-up with OB/GYN for further assessment and ultrasound of your ovarian cyst.  If you have worsening pain, vomiting, fevers, or any other concerning symptoms please return to the ER for reevaluation.
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
## 2020-02-11 ENCOUNTER — Encounter: Payer: Self-pay | Admitting: Obstetrics and Gynecology

## 2020-02-11 ENCOUNTER — Ambulatory Visit
Admission: AD | Admit: 2020-02-11 | Discharge: 2020-02-11 | Disposition: A | Discharge status: Home / Self Care | Payer: 59 | Source: Ambulatory Visit | Attending: Obstetrics and Gynecology | Admitting: Obstetrics and Gynecology

## 2020-02-11 ENCOUNTER — Other Ambulatory Visit: Payer: Self-pay

## 2020-02-11 ENCOUNTER — Ambulatory Visit: Payer: 59 | Admitting: Anesthesiology

## 2020-02-11 ENCOUNTER — Other Ambulatory Visit: Admission: RE | Admit: 2020-02-11 | Payer: 59 | Source: Ambulatory Visit | Admitting: Obstetrics and Gynecology

## 2020-02-11 ENCOUNTER — Encounter
Admission: AD | Disposition: A | Discharge status: Home / Self Care | Payer: Self-pay | Source: Ambulatory Visit | Attending: Obstetrics and Gynecology

## 2020-02-11 DIAGNOSIS — Z8249 Family history of ischemic heart disease and other diseases of the circulatory system: Secondary | ICD-10-CM | POA: Insufficient documentation

## 2020-02-11 DIAGNOSIS — Z79899 Other long term (current) drug therapy: Secondary | ICD-10-CM | POA: Insufficient documentation

## 2020-02-11 DIAGNOSIS — Z791 Long term (current) use of non-steroidal anti-inflammatories (NSAID): Secondary | ICD-10-CM | POA: Diagnosis not present

## 2020-02-11 DIAGNOSIS — J309 Allergic rhinitis, unspecified: Secondary | ICD-10-CM | POA: Insufficient documentation

## 2020-02-11 DIAGNOSIS — Z881 Allergy status to other antibiotic agents status: Secondary | ICD-10-CM | POA: Insufficient documentation

## 2020-02-11 DIAGNOSIS — R102 Pelvic and perineal pain: Secondary | ICD-10-CM | POA: Diagnosis present

## 2020-02-11 DIAGNOSIS — Z880 Allergy status to penicillin: Secondary | ICD-10-CM | POA: Diagnosis not present

## 2020-02-11 DIAGNOSIS — Z6841 Body Mass Index (BMI) 40.0 and over, adult: Secondary | ICD-10-CM | POA: Insufficient documentation

## 2020-02-11 DIAGNOSIS — K219 Gastro-esophageal reflux disease without esophagitis: Secondary | ICD-10-CM | POA: Diagnosis not present

## 2020-02-11 DIAGNOSIS — Z90710 Acquired absence of both cervix and uterus: Secondary | ICD-10-CM | POA: Diagnosis not present

## 2020-02-11 DIAGNOSIS — Z20822 Contact with and (suspected) exposure to covid-19: Secondary | ICD-10-CM | POA: Insufficient documentation

## 2020-02-11 DIAGNOSIS — Z888 Allergy status to other drugs, medicaments and biological substances status: Secondary | ICD-10-CM | POA: Insufficient documentation

## 2020-02-11 DIAGNOSIS — F329 Major depressive disorder, single episode, unspecified: Secondary | ICD-10-CM | POA: Insufficient documentation

## 2020-02-11 DIAGNOSIS — N8311 Corpus luteum cyst of right ovary: Secondary | ICD-10-CM | POA: Diagnosis not present

## 2020-02-11 DIAGNOSIS — F419 Anxiety disorder, unspecified: Secondary | ICD-10-CM | POA: Insufficient documentation

## 2020-02-11 DIAGNOSIS — I10 Essential (primary) hypertension: Secondary | ICD-10-CM | POA: Insufficient documentation

## 2020-02-11 DIAGNOSIS — Z87891 Personal history of nicotine dependence: Secondary | ICD-10-CM | POA: Insufficient documentation

## 2020-02-11 HISTORY — PX: LYSIS OF ADHESION: SHX5961

## 2020-02-11 HISTORY — PX: OVARIAN CYST REMOVAL: SHX89

## 2020-02-11 HISTORY — PX: CYSTOSCOPY: SHX5120

## 2020-02-11 LAB — COMPREHENSIVE METABOLIC PANEL
ALT: 12 U/L (ref 0–44)
AST: 12 U/L — ABNORMAL LOW (ref 15–41)
Albumin: 3.9 g/dL (ref 3.5–5.0)
Alkaline Phosphatase: 42 U/L (ref 38–126)
Anion gap: 11 (ref 5–15)
BUN: 14 mg/dL (ref 6–20)
CO2: 24 mmol/L (ref 22–32)
Calcium: 9 mg/dL (ref 8.9–10.3)
Chloride: 103 mmol/L (ref 98–111)
Creatinine, Ser: 0.75 mg/dL (ref 0.44–1.00)
GFR calc Af Amer: >60 mL/min (ref >60)
GFR calc non Af Amer: >60 mL/min (ref >60)
Glucose, Bld: 87 mg/dL (ref 70–99)
Potassium: 3.3 mmol/L — ABNORMAL LOW (ref 3.5–5.1)
Sodium: 138 mmol/L (ref 135–145)
Total Bilirubin: 0.6 mg/dL (ref 0.3–1.2)
Total Protein: 6.6 g/dL (ref 6.5–8.1)

## 2020-02-11 LAB — ABO/RH: ABO/RH(D): A POS

## 2020-02-11 LAB — CBC
HCT: 39.4 % (ref 36.0–46.0)
Hemoglobin: 13.4 g/dL (ref 12.0–15.0)
MCH: 30 pg (ref 26.0–34.0)
MCHC: 34 g/dL (ref 30.0–36.0)
MCV: 88.3 fL (ref 80.0–100.0)
Platelets: 330 10*3/uL (ref 150–400)
RBC: 4.46 MIL/uL (ref 3.87–5.11)
RDW: 12.3 % (ref 11.5–15.5)
WBC: 9.6 10*3/uL (ref 4.0–10.5)
nRBC: 0 % (ref 0.0–0.2)

## 2020-02-11 LAB — TYPE AND SCREEN
ABO/RH(D): A POS
Antibody Screen: NEGATIVE

## 2020-02-11 LAB — SARS CORONAVIRUS 2 BY RT PCR (HOSPITAL ORDER, PERFORMED IN ~~LOC~~ HOSPITAL LAB): SARS Coronavirus 2: NEGATIVE

## 2020-02-11 SURGERY — OOPHORECTOMY, LAPAROSCOPIC
Anesthesia: General | Site: Ureter | Laterality: Right

## 2020-02-11 MED ORDER — ACETAMINOPHEN 500 MG PO TABS
1000.0000 mg | ORAL_TABLET | Freq: Once | ORAL | Status: AC
  Administered 2020-02-11: 1000 mg via ORAL

## 2020-02-11 MED ORDER — IBUPROFEN 800 MG PO TABS: 800.0000 mg | ORAL_TABLET | Freq: Three times a day (TID) | ORAL | 1 refills | Status: AC

## 2020-02-11 MED ORDER — SUGAMMADEX SODIUM 500 MG/5ML IV SOLN
INTRAVENOUS | Status: DC | PRN
  Administered 2020-02-11: 200 mg via INTRAVENOUS

## 2020-02-11 MED ORDER — ROCURONIUM BROMIDE 100 MG/10ML IV SOLN
INTRAVENOUS | Status: DC | PRN
  Administered 2020-02-11: 50 mg via INTRAVENOUS
  Administered 2020-02-11 (×2): 10 mg via INTRAVENOUS
  Administered 2020-02-11: 30 mg via INTRAVENOUS

## 2020-02-11 MED ORDER — CHLORHEXIDINE GLUCONATE 0.12 % MT SOLN: 15.0000 mL | Freq: Once | OROMUCOSAL | Status: AC

## 2020-02-11 MED ORDER — DEXAMETHASONE SODIUM PHOSPHATE 10 MG/ML IJ SOLN
INTRAMUSCULAR | Status: DC | PRN
  Administered 2020-02-11: 10 mg via INTRAVENOUS

## 2020-02-11 MED ORDER — OXYCODONE HCL 5 MG PO TABS
ORAL_TABLET | ORAL | Status: AC
  Administered 2020-02-11: 5 mg via ORAL
  Filled 2020-02-11: qty 1

## 2020-02-11 MED ORDER — DEXMEDETOMIDINE HCL IN NACL 400 MCG/100ML IV SOLN
INTRAVENOUS | Status: DC | PRN
  Administered 2020-02-11 (×2): 4 ug via INTRAVENOUS
  Administered 2020-02-11: 12 ug via INTRAVENOUS

## 2020-02-11 MED ORDER — SCOPOLAMINE 1 MG/3DAYS TD PT72: 1.0000 | MEDICATED_PATCH | TRANSDERMAL | Status: DC

## 2020-02-11 MED ORDER — MIDAZOLAM HCL 2 MG/2ML IJ SOLN
INTRAMUSCULAR | Status: AC
  Filled 2020-02-11: qty 2

## 2020-02-11 MED ORDER — "VISTASEAL 4 ML SINGLE DOSE KIT "
PACK | CUTANEOUS | Status: DC | PRN
  Administered 2020-02-11: 4 mL via TOPICAL

## 2020-02-11 MED ORDER — GABAPENTIN 400 MG PO CAPS: 400.0000 mg | ORAL_CAPSULE | Freq: Once | ORAL | Status: AC

## 2020-02-11 MED ORDER — MIDAZOLAM HCL 2 MG/2ML IJ SOLN
INTRAMUSCULAR | Status: DC | PRN
  Administered 2020-02-11: 2 mg via INTRAVENOUS

## 2020-02-11 MED ORDER — FENTANYL CITRATE (PF) 100 MCG/2ML IJ SOLN
25.0000 ug | INTRAMUSCULAR | Status: DC | PRN
  Administered 2020-02-11: 25 ug via INTRAVENOUS

## 2020-02-11 MED ORDER — KETOROLAC TROMETHAMINE 30 MG/ML IJ SOLN
INTRAMUSCULAR | Status: AC
  Filled 2020-02-11: qty 1

## 2020-02-11 MED ORDER — DEXAMETHASONE SODIUM PHOSPHATE 10 MG/ML IJ SOLN
INTRAMUSCULAR | Status: AC
  Filled 2020-02-11: qty 1

## 2020-02-11 MED ORDER — GABAPENTIN 400 MG PO CAPS
ORAL_CAPSULE | ORAL | Status: AC
  Administered 2020-02-11: 400 mg via ORAL
  Filled 2020-02-11: qty 1

## 2020-02-11 MED ORDER — EPHEDRINE SULFATE 50 MG/ML IJ SOLN
INTRAMUSCULAR | Status: DC | PRN
  Administered 2020-02-11 (×2): 10 mg via INTRAVENOUS
  Administered 2020-02-11: 15 mg via INTRAVENOUS

## 2020-02-11 MED ORDER — PROPOFOL 10 MG/ML IV BOLUS
INTRAVENOUS | Status: DC | PRN
  Administered 2020-02-11: 200 mg via INTRAVENOUS

## 2020-02-11 MED ORDER — ONDANSETRON HCL 4 MG/2ML IJ SOLN: 4.0000 mg | Freq: Once | INTRAMUSCULAR | Status: DC | PRN

## 2020-02-11 MED ORDER — GABAPENTIN 800 MG PO TABS: 800.0000 mg | ORAL_TABLET | Freq: Every day | ORAL | 0 refills | Status: AC

## 2020-02-11 MED ORDER — GLYCOPYRROLATE 0.2 MG/ML IJ SOLN
INTRAMUSCULAR | Status: DC | PRN
  Administered 2020-02-11: .2 mg via INTRAVENOUS

## 2020-02-11 MED ORDER — CELECOXIB 200 MG PO CAPS
200.0000 mg | ORAL_CAPSULE | Freq: Once | ORAL | Status: AC
  Administered 2020-02-11: 200 mg via ORAL

## 2020-02-11 MED ORDER — OXYCODONE HCL 5 MG PO TABS: 5.0000 mg | ORAL_TABLET | ORAL | 0 refills | Status: DC | PRN

## 2020-02-11 MED ORDER — ONDANSETRON HCL 4 MG/2ML IJ SOLN
INTRAMUSCULAR | Status: AC
  Filled 2020-02-11: qty 2

## 2020-02-11 MED ORDER — EPHEDRINE 5 MG/ML INJ
INTRAVENOUS | Status: AC
  Filled 2020-02-11: qty 10

## 2020-02-11 MED ORDER — PROPOFOL 10 MG/ML IV BOLUS
INTRAVENOUS | Status: AC
  Filled 2020-02-11: qty 40

## 2020-02-11 MED ORDER — GABAPENTIN 300 MG PO CAPS
300.0000 mg | ORAL_CAPSULE | Freq: Once | ORAL | Status: AC
  Administered 2020-02-11: 300 mg via ORAL

## 2020-02-11 MED ORDER — FENTANYL CITRATE (PF) 100 MCG/2ML IJ SOLN
INTRAMUSCULAR | Status: DC | PRN
  Administered 2020-02-11 (×2): 50 ug via INTRAVENOUS

## 2020-02-11 MED ORDER — FENTANYL CITRATE (PF) 100 MCG/2ML IJ SOLN
INTRAMUSCULAR | Status: AC
  Filled 2020-02-11: qty 2

## 2020-02-11 MED ORDER — CHLORHEXIDINE GLUCONATE 0.12 % MT SOLN
OROMUCOSAL | Status: AC
  Administered 2020-02-11: 15 mL via OROMUCOSAL
  Filled 2020-02-11: qty 15

## 2020-02-11 MED ORDER — BUPIVACAINE HCL (PF) 0.5 % IJ SOLN
INTRAMUSCULAR | Status: AC
  Filled 2020-02-11: qty 30

## 2020-02-11 MED ORDER — ONDANSETRON HCL 4 MG/2ML IJ SOLN
INTRAMUSCULAR | Status: DC | PRN
  Administered 2020-02-11: 4 mg via INTRAVENOUS

## 2020-02-11 MED ORDER — PROPOFOL 10 MG/ML IV BOLUS
INTRAVENOUS | Status: AC
  Filled 2020-02-11: qty 20

## 2020-02-11 MED ORDER — LIDOCAINE HCL (CARDIAC) PF 100 MG/5ML IV SOSY
PREFILLED_SYRINGE | INTRAVENOUS | Status: DC | PRN
  Administered 2020-02-11: 100 mg via INTRAVENOUS

## 2020-02-11 MED ORDER — LACTATED RINGERS IV SOLN: INTRAVENOUS | Status: DC

## 2020-02-11 MED ORDER — ORAL CARE MOUTH RINSE: 15.0000 mL | Freq: Once | OROMUCOSAL | Status: AC

## 2020-02-11 MED ORDER — SCOPOLAMINE 1 MG/3DAYS TD PT72
MEDICATED_PATCH | TRANSDERMAL | Status: AC
  Administered 2020-02-11: 1.5 mg via TRANSDERMAL
  Filled 2020-02-11: qty 1

## 2020-02-11 MED ORDER — ACETAMINOPHEN 500 MG PO TABS: 1000.0000 mg | ORAL_TABLET | Freq: Four times a day (QID) | ORAL | 0 refills | Status: AC

## 2020-02-11 MED ORDER — DOCUSATE SODIUM 100 MG PO CAPS: 100.0000 mg | ORAL_CAPSULE | Freq: Two times a day (BID) | ORAL | 0 refills | Status: DC

## 2020-02-11 MED ORDER — PROPOFOL 500 MG/50ML IV EMUL
INTRAVENOUS | Status: DC | PRN
  Administered 2020-02-11: 20 ug/kg/min via INTRAVENOUS

## 2020-02-11 MED ORDER — BUPIVACAINE HCL 0.5 % IJ SOLN
INTRAMUSCULAR | Status: DC | PRN
  Administered 2020-02-11: 9 mL

## 2020-02-11 MED ORDER — FENTANYL CITRATE (PF) 100 MCG/2ML IJ SOLN
INTRAMUSCULAR | Status: AC
  Administered 2020-02-11: 25 ug via INTRAVENOUS
  Filled 2020-02-11: qty 2

## 2020-02-11 MED ORDER — OXYCODONE HCL 5 MG PO TABS: 5.0000 mg | ORAL_TABLET | Freq: Once | ORAL | Status: AC | PRN

## 2020-02-11 SURGICAL SUPPLY — 57 items
APPLICATOR ARISTA FLEXITIP XL (MISCELLANEOUS) ×5 IMPLANT
APPLICATOR VISTASEAL FLEXIBLE (MISCELLANEOUS) ×5 IMPLANT
BAG URINE DRAIN 2000ML AR STRL (UROLOGICAL SUPPLIES) ×5 IMPLANT
BLADE SURG SZ11 CARB STEEL (BLADE) ×5 IMPLANT
CATH FOLEY 2WAY  5CC 16FR (CATHETERS) ×1
CATH URTH 16FR FL 2W BLN LF (CATHETERS) ×4 IMPLANT
CHLORAPREP W/TINT 26 (MISCELLANEOUS) ×5 IMPLANT
CNTNR SPEC 2.5X3XGRAD LEK (MISCELLANEOUS) ×4
CONT SPEC 4OZ STER OR WHT (MISCELLANEOUS) ×1
CONTAINER SPEC 2.5X3XGRAD LEK (MISCELLANEOUS) ×4 IMPLANT
COVER WAND RF STERILE (DRAPES) IMPLANT
DERMABOND ADVANCED (GAUZE/BANDAGES/DRESSINGS) ×1
DERMABOND ADVANCED .7 DNX12 (GAUZE/BANDAGES/DRESSINGS) ×4 IMPLANT
DRAPE GENERAL ENDO 106X123.5 (DRAPES) ×5 IMPLANT
DRAPE LEGGINS SURG 28X43 STRL (DRAPES) ×5 IMPLANT
DRAPE STERI POUCH LG 24X46 STR (DRAPES) IMPLANT
DRAPE UNDER BUTTOCK W/FLU (DRAPES) ×5 IMPLANT
GLOVE BIO SURGEON STRL SZ7 (GLOVE) ×10 IMPLANT
GLOVE INDICATOR 7.5 STRL GRN (GLOVE) ×5 IMPLANT
GOWN STRL REUS W/ TWL LRG LVL3 (GOWN DISPOSABLE) ×8 IMPLANT
GOWN STRL REUS W/TWL LRG LVL3 (GOWN DISPOSABLE) ×2
GRASPER SUT TROCAR 14GX15 (MISCELLANEOUS) ×5 IMPLANT
HEMOSTAT ARISTA ABSORB 3G PWDR (HEMOSTASIS) ×5 IMPLANT
IRRIGATION STRYKERFLOW (MISCELLANEOUS) ×4 IMPLANT
IRRIGATOR STRYKERFLOW (MISCELLANEOUS) ×5
IV NS 1000ML (IV SOLUTION) ×1
IV NS 1000ML BAXH (IV SOLUTION) ×4 IMPLANT
KIT PINK PAD W/HEAD ARE REST (MISCELLANEOUS) ×5
KIT PINK PAD W/HEAD ARM REST (MISCELLANEOUS) ×4 IMPLANT
KIT TURNOVER CYSTO (KITS) ×5 IMPLANT
L-HOOK LAP DISP 36CM (ELECTROSURGICAL)
LABEL OR SOLS (LABEL) ×5 IMPLANT
LHOOK LAP DISP 36CM (ELECTROSURGICAL) IMPLANT
LIGASURE VESSEL 5MM BLUNT TIP (ELECTROSURGICAL) ×5 IMPLANT
NEEDLE FILTER BLUNT 18X 1/2SAF (NEEDLE) ×1
NEEDLE FILTER BLUNT 18X1 1/2 (NEEDLE) ×4 IMPLANT
NS IRRIG 500ML POUR BTL (IV SOLUTION) ×5 IMPLANT
PACK GYN LAPAROSCOPIC (MISCELLANEOUS) ×5 IMPLANT
PAD OB MATERNITY 4.3X12.25 (PERSONAL CARE ITEMS) ×5 IMPLANT
PAD PREP 24X41 OB/GYN DISP (PERSONAL CARE ITEMS) ×5 IMPLANT
PENCIL ELECTRO HAND CTR (MISCELLANEOUS) ×5 IMPLANT
POUCH SPECIMEN RETRIEVAL 10MM (ENDOMECHANICALS) ×5 IMPLANT
SCISSORS METZENBAUM CVD 33 (INSTRUMENTS) ×5 IMPLANT
SET CYSTO W/LG BORE CLAMP LF (SET/KITS/TRAYS/PACK) ×5 IMPLANT
SET TUBE SMOKE EVAC HIGH FLOW (TUBING) ×5 IMPLANT
SLEEVE ENDOPATH XCEL 5M (ENDOMECHANICALS) ×10 IMPLANT
STRIP CLOSURE SKIN 1/4X4 (GAUZE/BANDAGES/DRESSINGS) ×5 IMPLANT
SUT MNCRL AB 4-0 PS2 18 (SUTURE) ×5 IMPLANT
SUT VIC AB 2-0 UR6 27 (SUTURE) ×5 IMPLANT
SUT VIC AB 4-0 SH 27 (SUTURE) ×1
SUT VIC AB 4-0 SH 27XANBCTRL (SUTURE) ×4 IMPLANT
SUT VICRYL 0 AB UR-6 (SUTURE) ×10 IMPLANT
SYR 30ML LL (SYRINGE) ×5 IMPLANT
SYR 50ML LL SCALE MARK (SYRINGE) IMPLANT
SYR 5ML LL (SYRINGE) ×5 IMPLANT
TROCAR XCEL NON-BLD 5MMX100MML (ENDOMECHANICALS) ×5 IMPLANT
TUBING ART PRESS 48 MALE/FEM (TUBING) IMPLANT

## 2020-02-11 NOTE — Transfer of Care (Signed)
Immediate Anesthesia Transfer of Care Note  Patient: Dana Stevens  Procedure(s) Performed: Diagnostic Laparoscopy (Right ) CYSTOSCOPY (Ureter) LYSIS OF ADHESION OVARIAN CYSTECTOMY (Right Abdomen)  Patient Location: PACU  Anesthesia Type:General  Level of Consciousness: sedated and responds to stimulation  Airway & Oxygen Therapy: Patient Spontanous Breathing and Patient connected to face mask oxygen  Post-op Assessment: Report given to RN and Post -op Vital signs reviewed and stable  Post vital signs: Reviewed and stable  Last Vitals:  Vitals Value Taken Time  BP 120/64 02/11/20 2031  Temp 36.5 C 02/11/20 2031  Pulse 67 02/11/20 2035  Resp 22 02/11/20 2035  SpO2 100 % 02/11/20 2035  Vitals shown include unvalidated device data.  Last Pain:  Vitals:   02/11/20 2031  TempSrc:   PainSc: Asleep         Complications: No complications documented.

## 2020-02-11 NOTE — Op Note (Signed)
Dana Stevens PROCEDURE DATE: 02/11/2020  PREOPERATIVE DIAGNOSIS: Acute pelvic pain POSTOPERATIVE DIAGNOSIS: Right ovarian hemorrhagic cyst PROCEDURE:  -Operative laparoscopy -Lysis of adhesions, for more than 50% of the case -Right ovarian cystectomy -Cystoscopy  SURGEON:  Dr. Benjaman Kindler ASSISTANT: Dr. Vikki Ports Ward ANESTHESIOLOGIST: No responsible provider has been recorded for the case. Anesthesiologist: Gunnar Fusi, MD CRNA: Jerrye Noble, CRNA; Jonna Clark, CRNA  INDICATIONS: 54 y.o. F with history of 5 days of significant pelvic pain desiring surgical evaluation.  She has a history of total abdominal hysterectomy through an open incision  and a history of endometriosis.  On CT scan, a right ovarian hemorrhagic cyst was noted, with no evidence of free blood in the pelvis.  However, her pain remains an 8 out of 10, and we made the decision to proceed to the operating room for surgical exploration.   Please see preoperative notes for further details.  Risks of surgery were discussed with the patient including but not limited to: bleeding which may require transfusion or reoperation; infection which may require antibiotics; injury to bowel, bladder, ureters or other surrounding organs; need for additional procedures including laparotomy; thromboembolic phenomenon, incisional problems and other postoperative/anesthesia complications. Written informed consent was obtained.    FINDINGS: Absent uterus and fallopian tubes, significant bowel adhesions in the pelvis.  The left ovary was only partially visualized, as it was mostly retroperitoneal and hidden behind the sigmoid colon.  The right ovary was attached to the bladder and the cervical stump, and densely adherent to the sigmoid colon.  Washings were taken and sent to pathology.   Her liver, gallbladder, and omentum were within normal limits. Her appendix was visualized, and it was adherent to the right pelvic sidewall, but  did not appear to have any other abnormality.  No other abdominal/pelvic abnormality.  Normal upper abdomen.  ANESTHESIA:    General INTRAVENOUS FLUIDS: 1400 ml ESTIMATED BLOOD LOSS: 50 ml URINE OUTPUT: 400 ml SPECIMENS: Ovarian cyst contents, ovarian cyst wall, pelvic washings COMPLICATIONS: None immediate  PROCEDURE IN DETAIL:  The patient had sequential compression devices applied to her lower extremities while in the preoperative area.  She was then taken to the operating room where general anesthesia was administered and was found to be adequate.  She was placed in the dorsal lithotomy position, and was prepped and draped in a sterile manner.  A Foley catheter was inserted into her bladder and attached to constant drainage and a uterine manipulator was then advanced into the uterus .  After an adequate timeout was performed, attention was turned to the abdomen. An OG tube was placed, and a left upper quadrant incision was made.    The Optiview 5-mm trocar and sleeve were then advanced without difficulty with the laparoscope under direct visualization into the abdomen.  The abdomen was then insufflated with carbon dioxide gas and adequate pneumoperitoneum was obtained.   A detailed survey of the patient's pelvis and abdomen revealed the findings as mentioned above.   An additional 64mm trocar was placed in the right lower quadrants under direct visualization, and at 48mm trocar was placed in the opposite quadrant under direct visualization.  Later in the case, an umbilical incision was made with the scalpel to allow for better visualization.  Evaluation of the pelvic sidewalls to observe the course of the ureters was undertaken, assuring the ureter was outside the operative field.  However, neither ureter was visualized.  The left ureter was not visualized because of the bowel adhesions,  and the right ureter was never seen although we were able to visualize the area in which we expected  it.  Pelvic washings were taken.  Some bloody pelvic fluid was noted prior to the start of the case.  The dense adhesions were carefully dissected away from the pelvic sidewall, the anterior abdominal wall, and the left sigmoid colon.  This was done in a stepwise manner, being careful to stay with the filmy adherent adhesion tissue, with no bowel injury noted.  The right ovary was also dissected away from the bowel, but as it was densely adherent to the serosa of the bowel and densely adherent to the bladder, dissecting the ovary out became too risky.  Our decision was made at that point to proceed with cystectomy instead of right oophorectomy.  We also tried to dissect the left ovary out from the pelvic sidewall, but its placement and our inability to see the surrounding structures caused [place] to leave it intact.  Right ovarian cystectomy The ovarian cyst was evaluated and dissected out from the normal ovarian tissue. Hemostasis was assured with electrocautery, and hemostatic agents.  We were unable to proceed with a significant amount of electrocautery, because the ovary was placed over the bladder and over the serosa of the bowel.  Therefore, we used Arista and thrombin liquid, with pressure and significant time watching, to assure hemostasis  The pelvis was irrigated and all fluid and blood removed.  The operative site was surveyed, and it was found to be hemostatic.  No intraoperative injury to surrounding organs was noted. The fascia of the 10 mm trocar site was closed with 0 Vicryl.   Pictures were taken of the quadrants and pelvis. The abdomen was desufflated and all instruments were then removed from the patient's abdomen. The uterine manipulator was removed without complications.  All incisions were closed with 4-0 Vicryl and Dermabond.   CYSTOCOPY PARAGRAPH We decided to move forward with cystoscopy as the edges of the bladder were unable to visualize behind the adhesions.  The Foley  catheter was temporarily removed and an uncomplicated cystoscopy was performed. Excellent efflux was noted from both ureteral orifices.  No damage to the bladder mucosa was noted with full visualization of all parts.  The patient tolerated the procedures well.  All instruments, needles, and sponge counts were correct x 2. The patient was taken to the recovery room in stable condition.

## 2020-02-11 NOTE — Anesthesia Procedure Notes (Signed)
Procedure Name: Intubation Date/Time: 02/11/2020 5:54 PM Performed by: Jerrye Noble, CRNA Pre-anesthesia Checklist: Patient identified, Emergency Drugs available, Suction available and Patient being monitored Patient Re-evaluated:Patient Re-evaluated prior to induction Oxygen Delivery Method: Circle system utilized Preoxygenation: Pre-oxygenation with 100% oxygen Induction Type: IV induction Ventilation: Mask ventilation without difficulty Laryngoscope Size: McGraph and 3 Grade View: Grade I Tube type: Oral Tube size: 7.0 mm Number of attempts: 1 Airway Equipment and Method: Stylet and Video-laryngoscopy Placement Confirmation: ETT inserted through vocal cords under direct vision,  positive ETCO2 and breath sounds checked- equal and bilateral Secured at: 22 cm Tube secured with: Tape Dental Injury: Teeth and Oropharynx as per pre-operative assessment

## 2020-02-11 NOTE — Discharge Instructions (Signed)
Laparoscopic Ovarian Surgery Discharge Instructions  For the next three days, take ibuprofen and acetaminophen on a schedule, every 8 hours. You can take them together or you can intersperse them, and take one every four hours. I also gave you gabapentin for nighttime, to help you sleep and also to control pain. Take gabapentin medicines at night for at least the next 3 nights. You also have a narcotic, oxycodone, to take as needed if the above medicines don't help.  Postop constipation is a major cause of pain. Stay well hydrated, walk as you tolerate, and take over the counter senna as well as stool softeners if you need them.   RISKS AND COMPLICATIONS   Infection.  Bleeding.  Injury to surrounding organs.  Anesthetic side effects.   PROCEDURE   You may be given a medicine to help you relax (sedative) before the procedure. You will be given a medicine to make you sleep (general anesthetic) during the procedure.  A tube will be put down your throat to help your breath while under general anesthesia.  Several small cuts (incisions) are made in the lower abdominal area and one incision is made near the belly button.  Your abdominal area will be inflated with a safe gas (carbon dioxide). This helps give the surgeon room to operate, visualize, and helps the surgeon avoid other organs.  A thin, lighted tube (laparoscope) with a camera attached is inserted into your abdomen through the incision near the belly button. Other small instruments may also be inserted through other abdominal incisions.  The ovary is located and are removed.  After the ovary is removed, the gas is released from the abdomen.  The incisions will be closed with stitches (sutures), and Dermabond. A bandage may be placed over the incisions.  AFTER THE PROCEDURE   You will also have some mild abdominal discomfort for 3-7 days. You will be given pain medicine to ease any discomfort.  As long as there are no  problems, you may be allowed to go home. Someone will need to drive you home and be with you for at least 24 hours once home.  You may have some mild discomfort in the throat. This is from the tube placed in your throat while you were sleeping.  You may experience discomfort in the shoulder area from some trapped air between the liver and diaphragm. This sensation is normal and will slowly go away on its own.  HOME CARE INSTRUCTIONS   Take all medicines as directed.  Only take over-the-counter or prescription medicines for pain, discomfort, or fever as directed by your caregiver.  Resume daily activities as directed.  Showers are preferred over baths for 2 weeks.  You may resume sexual activities in 1 week or as you feel you would like to.  Do not drive while taking narcotics.  SEEK MEDICAL CARE IF: .  There is increasing abdominal pain.  You feel lightheaded or faint.  You have the chills.  You have an oral temperature above 102 F (38.9 C).  There is pus-like (purulent) drainage from any of the wounds.  You are unable to pass gas or have a bowel movement.  You feel sick to your stomach (nauseous) or throw up (vomit) and can't control it with your medicines.  MAKE SURE YOU:   Understand these instructions.  Will watch your condition.  Will get help right away if you are not doing well or get worse.  ExitCare Patient Information 2013 [employer]     AMBULATORY SURGERY  DISCHARGE INSTRUCTIONS   1) The drugs that you were given will stay in your system until tomorrow so for the next 24 hours you should not:  A) Drive an automobile B) Make any legal decisions C) Drink any alcoholic beverage   2) You may resume regular meals tomorrow.  Today it is better to start with liquids and gradually work up to solid foods.  You may eat anything you prefer, but it is better to start with liquids, then soup and crackers, and gradually work up to solid  foods.   3) Please notify your doctor immediately if you have any unusual bleeding, trouble breathing, redness and pain at the surgery site, drainage, fever, or pain not relieved by medication.    4) Additional Instructions:        Please contact your physician with any problems or Same Day Surgery at [phone], Monday through Friday 6 am to 4 pm, or [place] [facility] Main number at [phone].   

## 2020-02-11 NOTE — Anesthesia Preprocedure Evaluation (Signed)
Anesthesia Evaluation  Patient identified by MRN, date of birth, ID band Patient awake    Reviewed: Allergy & Precautions, NPO status , Patient's Chart, lab work & pertinent test results  History of Anesthesia Complications (+) PONV and history of anesthetic complications  Airway Mallampati: III       Dental   Pulmonary neg sleep apnea, neg COPD, Not current smoker, former smoker,           Cardiovascular hypertension, Pt. on medications (-) Past MI and (-) CHF (-) dysrhythmias (-) Valvular Problems/Murmurs     Neuro/Psych neg Seizures Anxiety Depression    GI/Hepatic Neg liver ROS, neg GERD  ,  Endo/Other  Morbid obesity  Renal/GU negative Renal ROS     Musculoskeletal   Abdominal   Peds  Hematology   Anesthesia Other Findings   Reproductive/Obstetrics                             Anesthesia Physical Anesthesia Plan  ASA: III  Anesthesia Plan: General   Post-op Pain Management:    Induction: Intravenous  PONV Risk Score and Plan: 4 or greater and Ondansetron, Dexamethasone, Propofol infusion, Scopolamine patch - Pre-op and Treatment may vary due to age or medical condition  Airway Management Planned: Oral ETT  Additional Equipment:   Intra-op Plan:   Post-operative Plan:   Informed Consent: I have reviewed the patients History and Physical, chart, labs and discussed the procedure including the risks, benefits and alternatives for the proposed anesthesia with the patient or authorized representative who has indicated his/her understanding and acceptance.       Plan Discussed with:   Anesthesia Plan Comments:         Anesthesia Quick Evaluation

## 2020-02-11 NOTE — Anesthesia Postprocedure Evaluation (Signed)
Anesthesia Post Note  Patient: Dana Stevens  Procedure(s) Performed: Diagnostic Laparoscopy (Right ) CYSTOSCOPY (Ureter) LYSIS OF ADHESION OVARIAN CYSTECTOMY (Right Abdomen)  Patient location during evaluation: PACU Anesthesia Type: General Level of consciousness: awake and alert Pain management: pain level controlled Vital Signs Assessment: post-procedure vital signs reviewed and stable Respiratory status: spontaneous breathing and respiratory function stable Cardiovascular status: stable Anesthetic complications: no   No complications documented.   Last Vitals:  Vitals:   02/11/20 2055 02/11/20 2100  BP:  136/75  Pulse: 65 63  Resp: 13 14  Temp:  (!) 36.3 C  SpO2: 98% 98%    Last Pain:  Vitals:   02/11/20 2055  TempSrc:   PainSc: 5                  Zuri Bradway K
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
## 2020-02-12 ENCOUNTER — Encounter: Payer: Self-pay | Admitting: Obstetrics and Gynecology

## 2020-02-13 LAB — SURGICAL PATHOLOGY

## 2020-02-20 ENCOUNTER — Other Ambulatory Visit: Payer: Self-pay | Admitting: Obstetrics and Gynecology

## 2020-02-20 ENCOUNTER — Other Ambulatory Visit: Payer: Self-pay

## 2020-02-20 ENCOUNTER — Ambulatory Visit
Admission: RE | Admit: 2020-02-20 | Discharge: 2020-02-20 | Disposition: A | Discharge status: Home / Self Care | Payer: 59 | Source: Ambulatory Visit | Attending: Obstetrics and Gynecology | Admitting: Obstetrics and Gynecology

## 2020-02-20 DIAGNOSIS — Z9889 Other specified postprocedural states: Secondary | ICD-10-CM

## 2020-02-20 DIAGNOSIS — G8918 Other acute postprocedural pain: Secondary | ICD-10-CM | POA: Diagnosis present

## 2020-02-20 DIAGNOSIS — R3989 Other symptoms and signs involving the genitourinary system: Secondary | ICD-10-CM

## 2020-02-20 MED ORDER — IOHEXOL 300 MG/ML  SOLN
125.0000 mL | Freq: Once | INTRAMUSCULAR | Status: AC | PRN
  Administered 2020-02-20: 150 mL via INTRAVENOUS

## 2020-02-24 ENCOUNTER — Other Ambulatory Visit: Payer: Self-pay | Admitting: Obstetrics and Gynecology

## 2020-02-24 DIAGNOSIS — R102 Pelvic and perineal pain: Secondary | ICD-10-CM

## 2020-02-24 DIAGNOSIS — G8918 Other acute postprocedural pain: Secondary | ICD-10-CM

## 2020-02-25 ENCOUNTER — Other Ambulatory Visit: Payer: Self-pay

## 2020-02-25 ENCOUNTER — Ambulatory Visit
Admission: RE | Admit: 2020-02-25 | Discharge: 2020-02-25 | Disposition: A | Discharge status: Home / Self Care | Payer: 59 | Source: Ambulatory Visit | Attending: Obstetrics and Gynecology | Admitting: Obstetrics and Gynecology

## 2020-02-25 DIAGNOSIS — R102 Pelvic and perineal pain: Secondary | ICD-10-CM | POA: Diagnosis present

## 2020-02-25 DIAGNOSIS — G8918 Other acute postprocedural pain: Secondary | ICD-10-CM | POA: Diagnosis present

## 2020-11-19 ENCOUNTER — Emergency Department: Payer: 59

## 2020-11-19 ENCOUNTER — Other Ambulatory Visit: Payer: Self-pay

## 2020-11-19 ENCOUNTER — Emergency Department
Admission: EM | Admit: 2020-11-19 | Discharge: 2020-11-19 | Disposition: A | Discharge status: Home / Self Care | Payer: 59 | Attending: Emergency Medicine | Admitting: Emergency Medicine

## 2020-11-19 DIAGNOSIS — R1013 Epigastric pain: Secondary | ICD-10-CM | POA: Insufficient documentation

## 2020-11-19 DIAGNOSIS — Z87891 Personal history of nicotine dependence: Secondary | ICD-10-CM | POA: Diagnosis not present

## 2020-11-19 DIAGNOSIS — I10 Essential (primary) hypertension: Secondary | ICD-10-CM | POA: Diagnosis not present

## 2020-11-19 DIAGNOSIS — R1011 Right upper quadrant pain: Secondary | ICD-10-CM | POA: Diagnosis not present

## 2020-11-19 DIAGNOSIS — Z91040 Latex allergy status: Secondary | ICD-10-CM | POA: Diagnosis not present

## 2020-11-19 DIAGNOSIS — R197 Diarrhea, unspecified: Secondary | ICD-10-CM | POA: Diagnosis not present

## 2020-11-19 DIAGNOSIS — Z79899 Other long term (current) drug therapy: Secondary | ICD-10-CM | POA: Diagnosis not present

## 2020-11-19 LAB — COMPREHENSIVE METABOLIC PANEL
ALT: 17 U/L (ref 0–44)
AST: 17 U/L (ref 15–41)
Albumin: 4.1 g/dL (ref 3.5–5.0)
Alkaline Phosphatase: 46 U/L (ref 38–126)
Anion gap: 9 (ref 5–15)
BUN: 15 mg/dL (ref 6–20)
CO2: 24 mmol/L (ref 22–32)
Calcium: 8.8 mg/dL — ABNORMAL LOW (ref 8.9–10.3)
Chloride: 102 mmol/L (ref 98–111)
Creatinine, Ser: 0.79 mg/dL (ref 0.44–1.00)
GFR, Estimated: >60 mL/min (ref >60)
Glucose, Bld: 88 mg/dL (ref 70–99)
Potassium: 3.1 mmol/L — ABNORMAL LOW (ref 3.5–5.1)
Sodium: 135 mmol/L (ref 135–145)
Total Bilirubin: 0.7 mg/dL (ref 0.3–1.2)
Total Protein: 7.2 g/dL (ref 6.5–8.1)

## 2020-11-19 LAB — CBC
HCT: 41.5 % (ref 36.0–46.0)
Hemoglobin: 13.8 g/dL (ref 12.0–15.0)
MCH: 29.7 pg (ref 26.0–34.0)
MCHC: 33.3 g/dL (ref 30.0–36.0)
MCV: 89.4 fL (ref 80.0–100.0)
Platelets: 364 10*3/uL (ref 150–400)
RBC: 4.64 MIL/uL (ref 3.87–5.11)
RDW: 12.3 % (ref 11.5–15.5)
WBC: 8.1 10*3/uL (ref 4.0–10.5)
nRBC: 0 % (ref 0.0–0.2)

## 2020-11-19 LAB — LIPASE, BLOOD: Lipase: 34 U/L (ref 11–51)

## 2020-11-19 MED ORDER — SODIUM CHLORIDE 0.9 % IV BOLUS
1000.0000 mL | Freq: Once | INTRAVENOUS | Status: AC
  Administered 2020-11-19: 1000 mL via INTRAVENOUS

## 2020-11-19 MED ORDER — OMEPRAZOLE 40 MG PO CPDR: 40.0000 mg | DELAYED_RELEASE_CAPSULE | Freq: Every day | ORAL | 1 refills | Status: AC

## 2020-11-19 MED ORDER — SUCRALFATE 1 G PO TABS: 1.0000 g | ORAL_TABLET | Freq: Four times a day (QID) | ORAL | 0 refills | Status: AC

## 2020-11-19 MED ORDER — LIDOCAINE VISCOUS HCL 2 % MT SOLN: 15.0000 mL | OROMUCOSAL | 0 refills | Status: AC | PRN

## 2020-11-19 MED ORDER — FENTANYL CITRATE (PF) 100 MCG/2ML IJ SOLN
50.0000 ug | Freq: Once | INTRAMUSCULAR | Status: AC
  Administered 2020-11-19: 50 ug via INTRAVENOUS
  Filled 2020-11-19: qty 2

## 2020-11-19 NOTE — Discharge Instructions (Addendum)
Please seek medical attention for any high fevers, chest pain, shortness of breath, change in behavior, persistent vomiting, bloody stool or any other new or concerning symptoms.  

## 2020-11-19 NOTE — ED Triage Notes (Signed)
C/O RUQ abdominal pain.  States had an outpatient US done, but physician is not in the office until Monday to read result.  C/O pain.  AAOX3.  Skin warm and dry. NAD
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
## 2020-11-19 NOTE — ED Notes (Signed)
Red, lavender, green top tubes were obtained.

## 2020-11-19 NOTE — ED Triage Notes (Signed)
Pt here with flank pain that started for years but has been steady for weeks. Pt has diarrhea for 2 weeks as well.

## 2020-11-19 NOTE — ED Notes (Signed)
Patient was given a 2nd blanket.

## 2020-11-30 ENCOUNTER — Encounter
Admission: AD | Disposition: A | Discharge status: Home / Self Care | Payer: Self-pay | Source: Ambulatory Visit | Attending: General Surgery

## 2020-11-30 ENCOUNTER — Ambulatory Visit: Payer: Self-pay | Admitting: General Surgery

## 2020-11-30 ENCOUNTER — Ambulatory Visit: Payer: 59 | Admitting: Anesthesiology

## 2020-11-30 ENCOUNTER — Ambulatory Visit
Admission: AD | Admit: 2020-11-30 | Discharge: 2020-11-30 | Disposition: A | Discharge status: Home / Self Care | Payer: 59 | Source: Ambulatory Visit | Attending: General Surgery | Admitting: General Surgery

## 2020-11-30 ENCOUNTER — Other Ambulatory Visit: Payer: Self-pay

## 2020-11-30 ENCOUNTER — Encounter: Payer: Self-pay | Admitting: General Surgery

## 2020-11-30 DIAGNOSIS — Z87891 Personal history of nicotine dependence: Secondary | ICD-10-CM | POA: Insufficient documentation

## 2020-11-30 DIAGNOSIS — Z881 Allergy status to other antibiotic agents status: Secondary | ICD-10-CM | POA: Diagnosis not present

## 2020-11-30 DIAGNOSIS — Z8249 Family history of ischemic heart disease and other diseases of the circulatory system: Secondary | ICD-10-CM | POA: Insufficient documentation

## 2020-11-30 DIAGNOSIS — Z888 Allergy status to other drugs, medicaments and biological substances status: Secondary | ICD-10-CM | POA: Insufficient documentation

## 2020-11-30 DIAGNOSIS — Z79899 Other long term (current) drug therapy: Secondary | ICD-10-CM | POA: Insufficient documentation

## 2020-11-30 DIAGNOSIS — I10 Essential (primary) hypertension: Secondary | ICD-10-CM | POA: Insufficient documentation

## 2020-11-30 DIAGNOSIS — K8010 Calculus of gallbladder with chronic cholecystitis without obstruction: Secondary | ICD-10-CM | POA: Diagnosis not present

## 2020-11-30 DIAGNOSIS — Z91040 Latex allergy status: Secondary | ICD-10-CM | POA: Diagnosis not present

## 2020-11-30 DIAGNOSIS — Z8371 Family history of colonic polyps: Secondary | ICD-10-CM | POA: Diagnosis not present

## 2020-11-30 DIAGNOSIS — K8020 Calculus of gallbladder without cholecystitis without obstruction: Secondary | ICD-10-CM | POA: Diagnosis present

## 2020-11-30 DIAGNOSIS — Z880 Allergy status to penicillin: Secondary | ICD-10-CM | POA: Diagnosis not present

## 2020-11-30 DIAGNOSIS — K828 Other specified diseases of gallbladder: Secondary | ICD-10-CM | POA: Diagnosis not present

## 2020-11-30 SURGERY — CHOLECYSTECTOMY, ROBOT-ASSISTED, LAPAROSCOPIC
Anesthesia: General | Site: Abdomen

## 2020-11-30 MED ORDER — INDOCYANINE GREEN 25 MG IV SOLR
1.2500 mg | Freq: Once | INTRAVENOUS | Status: AC
  Administered 2020-11-30: 1.25 mg via INTRAVENOUS
  Filled 2020-11-30: qty 0.5

## 2020-11-30 MED ORDER — LACTATED RINGERS IV SOLN: INTRAVENOUS | Status: DC

## 2020-11-30 MED ORDER — FENTANYL CITRATE (PF) 100 MCG/2ML IJ SOLN
INTRAMUSCULAR | Status: DC | PRN
  Administered 2020-11-30: 100 ug via INTRAVENOUS

## 2020-11-30 MED ORDER — ROCURONIUM BROMIDE 10 MG/ML (PF) SYRINGE
PREFILLED_SYRINGE | INTRAVENOUS | Status: AC
  Filled 2020-11-30: qty 10

## 2020-11-30 MED ORDER — GLYCOPYRROLATE 0.2 MG/ML IJ SOLN
INTRAMUSCULAR | Status: DC | PRN
  Administered 2020-11-30: .2 mg via INTRAVENOUS

## 2020-11-30 MED ORDER — FAMOTIDINE 20 MG PO TABS: 20.0000 mg | ORAL_TABLET | Freq: Once | ORAL | Status: DC

## 2020-11-30 MED ORDER — APREPITANT 40 MG PO CAPS
ORAL_CAPSULE | ORAL | Status: AC
  Administered 2020-11-30: 40 mg via ORAL
  Filled 2020-11-30: qty 1

## 2020-11-30 MED ORDER — MIDAZOLAM HCL 2 MG/2ML IJ SOLN
INTRAMUSCULAR | Status: AC
  Filled 2020-11-30: qty 2

## 2020-11-30 MED ORDER — PROMETHAZINE HCL 25 MG/ML IJ SOLN
INTRAMUSCULAR | Status: AC
  Administered 2020-11-30: 12.5 mg via INTRAVENOUS
  Filled 2020-11-30: qty 1

## 2020-11-30 MED ORDER — APREPITANT 40 MG PO CAPS: 40.0000 mg | ORAL_CAPSULE | Freq: Once | ORAL | Status: AC

## 2020-11-30 MED ORDER — OXYCODONE HCL 5 MG PO TABS
ORAL_TABLET | ORAL | Status: AC
  Filled 2020-11-30: qty 1

## 2020-11-30 MED ORDER — LIDOCAINE HCL (PF) 2 % IJ SOLN
INTRAMUSCULAR | Status: AC
  Filled 2020-11-30: qty 5

## 2020-11-30 MED ORDER — FENTANYL CITRATE (PF) 100 MCG/2ML IJ SOLN
INTRAMUSCULAR | Status: AC
  Filled 2020-11-30: qty 2

## 2020-11-30 MED ORDER — MIDAZOLAM HCL 2 MG/2ML IJ SOLN
INTRAMUSCULAR | Status: DC | PRN
  Administered 2020-11-30: 2 mg via INTRAVENOUS

## 2020-11-30 MED ORDER — CLINDAMYCIN PHOSPHATE 900 MG/50ML IV SOLN
INTRAVENOUS | Status: AC
  Filled 2020-11-30: qty 50

## 2020-11-30 MED ORDER — ONDANSETRON HCL 4 MG/2ML IJ SOLN
INTRAMUSCULAR | Status: AC
  Filled 2020-11-30: qty 2

## 2020-11-30 MED ORDER — BUPIVACAINE-EPINEPHRINE (PF) 0.25% -1:200000 IJ SOLN
INTRAMUSCULAR | Status: AC
  Filled 2020-11-30: qty 30

## 2020-11-30 MED ORDER — ONDANSETRON HCL 4 MG/2ML IJ SOLN
INTRAMUSCULAR | Status: DC | PRN
  Administered 2020-11-30: 4 mg via INTRAVENOUS

## 2020-11-30 MED ORDER — SUGAMMADEX SODIUM 200 MG/2ML IV SOLN
INTRAVENOUS | Status: DC | PRN
  Administered 2020-11-30: 200 mg via INTRAVENOUS

## 2020-11-30 MED ORDER — FENTANYL CITRATE (PF) 100 MCG/2ML IJ SOLN
INTRAMUSCULAR | Status: AC
  Administered 2020-11-30: 25 ug via INTRAVENOUS
  Filled 2020-11-30: qty 2

## 2020-11-30 MED ORDER — PROPOFOL 10 MG/ML IV BOLUS
INTRAVENOUS | Status: DC | PRN
  Administered 2020-11-30: 150 mg via INTRAVENOUS

## 2020-11-30 MED ORDER — PROPOFOL 500 MG/50ML IV EMUL
INTRAVENOUS | Status: AC
  Filled 2020-11-30: qty 50

## 2020-11-30 MED ORDER — CHLORHEXIDINE GLUCONATE 0.12 % MT SOLN
OROMUCOSAL | Status: AC
  Administered 2020-11-30: 15 mL via OROMUCOSAL
  Filled 2020-11-30: qty 15

## 2020-11-30 MED ORDER — CHLORHEXIDINE GLUCONATE 0.12 % MT SOLN: 15.0000 mL | Freq: Once | OROMUCOSAL | Status: AC

## 2020-11-30 MED ORDER — SODIUM CHLORIDE 0.9 % IV SOLN
INTRAVENOUS | Status: DC | PRN
  Administered 2020-11-30: 30 ug/min via INTRAVENOUS

## 2020-11-30 MED ORDER — DEXAMETHASONE SODIUM PHOSPHATE 10 MG/ML IJ SOLN
INTRAMUSCULAR | Status: DC | PRN
  Administered 2020-11-30: 8 mg via INTRAVENOUS

## 2020-11-30 MED ORDER — ACETAMINOPHEN 10 MG/ML IV SOLN
INTRAVENOUS | Status: DC | PRN
  Administered 2020-11-30: 1000 mg via INTRAVENOUS

## 2020-11-30 MED ORDER — DEXMEDETOMIDINE (PRECEDEX) IN NS 20 MCG/5ML (4 MCG/ML) IV SYRINGE
PREFILLED_SYRINGE | INTRAVENOUS | Status: DC | PRN
  Administered 2020-11-30: 8 ug via INTRAVENOUS

## 2020-11-30 MED ORDER — PROPOFOL 10 MG/ML IV BOLUS
INTRAVENOUS | Status: AC
  Filled 2020-11-30: qty 20

## 2020-11-30 MED ORDER — PROPOFOL 500 MG/50ML IV EMUL
INTRAVENOUS | Status: DC | PRN
  Administered 2020-11-30: 150 ug/kg/min via INTRAVENOUS

## 2020-11-30 MED ORDER — PHENYLEPHRINE HCL (PRESSORS) 10 MG/ML IV SOLN
INTRAVENOUS | Status: AC
  Filled 2020-11-30: qty 1

## 2020-11-30 MED ORDER — ROCURONIUM BROMIDE 100 MG/10ML IV SOLN
INTRAVENOUS | Status: DC | PRN
  Administered 2020-11-30: 50 mg via INTRAVENOUS
  Administered 2020-11-30: 10 mg via INTRAVENOUS

## 2020-11-30 MED ORDER — DEXAMETHASONE SODIUM PHOSPHATE 10 MG/ML IJ SOLN
INTRAMUSCULAR | Status: AC
  Filled 2020-11-30: qty 1

## 2020-11-30 MED ORDER — OXYCODONE HCL 5 MG PO TABS
5.0000 mg | ORAL_TABLET | Freq: Once | ORAL | Status: AC
Start: 2020-11-30 — End: 2020-11-30
  Administered 2020-11-30: 5 mg via ORAL

## 2020-11-30 MED ORDER — DEXMEDETOMIDINE (PRECEDEX) IN NS 20 MCG/5ML (4 MCG/ML) IV SYRINGE
PREFILLED_SYRINGE | INTRAVENOUS | Status: AC
  Filled 2020-11-30: qty 5

## 2020-11-30 MED ORDER — ACETAMINOPHEN 10 MG/ML IV SOLN
INTRAVENOUS | Status: AC
  Filled 2020-11-30: qty 100

## 2020-11-30 MED ORDER — BUPIVACAINE-EPINEPHRINE 0.25% -1:200000 IJ SOLN
INTRAMUSCULAR | Status: DC | PRN
  Administered 2020-11-30: 20 mL

## 2020-11-30 MED ORDER — ORAL CARE MOUTH RINSE: 15.0000 mL | Freq: Once | OROMUCOSAL | Status: AC

## 2020-11-30 MED ORDER — GLYCOPYRROLATE 0.2 MG/ML IJ SOLN
INTRAMUSCULAR | Status: AC
  Filled 2020-11-30: qty 1

## 2020-11-30 MED ORDER — FENTANYL CITRATE (PF) 100 MCG/2ML IJ SOLN
25.0000 ug | INTRAMUSCULAR | Status: DC | PRN
  Administered 2020-11-30: 25 ug via INTRAVENOUS

## 2020-11-30 MED ORDER — PROMETHAZINE HCL 25 MG/ML IJ SOLN: 6.2500 mg | INTRAMUSCULAR | Status: DC | PRN

## 2020-11-30 MED ORDER — LIDOCAINE HCL (CARDIAC) PF 100 MG/5ML IV SOSY
PREFILLED_SYRINGE | INTRAVENOUS | Status: DC | PRN
  Administered 2020-11-30: 80 mg via INTRAVENOUS

## 2020-11-30 MED ORDER — CLINDAMYCIN PHOSPHATE 900 MG/50ML IV SOLN
900.0000 mg | INTRAVENOUS | Status: AC
  Administered 2020-11-30: 900 mg via INTRAVENOUS

## 2020-11-30 SURGICAL SUPPLY — 52 items
ADH SKN CLS APL DERMABOND .7 (GAUZE/BANDAGES/DRESSINGS) ×1
APL PRP STRL LF DISP 70% ISPRP (MISCELLANEOUS) ×1
BAG INFUSER PRESSURE 100CC (MISCELLANEOUS) IMPLANT
BAG SPEC RTRVL LRG 6X4 10 (ENDOMECHANICALS) ×1
BLADE SURG SZ11 CARB STEEL (BLADE) ×2 IMPLANT
CANNULA REDUC XI 12-8 STAPL (CANNULA) ×1
CANNULA REDUCER 12-8 DVNC XI (CANNULA) ×1 IMPLANT
CHLORAPREP W/TINT 26 (MISCELLANEOUS) ×2 IMPLANT
CLIP VESOLOCK MED LG 6/CT (CLIP) ×2 IMPLANT
COVER WAND RF STERILE (DRAPES) ×2 IMPLANT
DECANTER SPIKE VIAL GLASS SM (MISCELLANEOUS) ×4 IMPLANT
DEFOGGER SCOPE WARMER CLEARIFY (MISCELLANEOUS) ×2 IMPLANT
DERMABOND ADVANCED (GAUZE/BANDAGES/DRESSINGS) ×1
DERMABOND ADVANCED .7 DNX12 (GAUZE/BANDAGES/DRESSINGS) ×1 IMPLANT
DRAPE ARM DVNC X/XI (DISPOSABLE) ×4 IMPLANT
DRAPE COLUMN DVNC XI (DISPOSABLE) ×1 IMPLANT
DRAPE DA VINCI XI ARM (DISPOSABLE) ×4
DRAPE DA VINCI XI COLUMN (DISPOSABLE) ×1
ELECT REM PT RETURN 9FT ADLT (ELECTROSURGICAL) ×2
ELECTRODE REM PT RTRN 9FT ADLT (ELECTROSURGICAL) ×1 IMPLANT
GLOVE SURG ENC MOIS LTX SZ6.5 (GLOVE) ×4 IMPLANT
GLOVE SURG UNDER POLY LF SZ6.5 (GLOVE) ×4 IMPLANT
GOWN STRL REUS W/ TWL LRG LVL3 (GOWN DISPOSABLE) ×3 IMPLANT
GOWN STRL REUS W/TWL LRG LVL3 (GOWN DISPOSABLE) ×6
GRASPER SUT TROCAR 14GX15 (MISCELLANEOUS) ×2 IMPLANT
IRRIGATOR SUCT 8 DISP DVNC XI (IRRIGATION / IRRIGATOR) IMPLANT
IRRIGATOR SUCTION 8MM XI DISP (IRRIGATION / IRRIGATOR)
IV NS 1000ML (IV SOLUTION)
IV NS 1000ML BAXH (IV SOLUTION) IMPLANT
KIT PINK PAD W/HEAD ARE REST (MISCELLANEOUS) ×2 IMPLANT
KIT PINK PAD W/HEAD ARM REST (MISCELLANEOUS) ×1 IMPLANT
LABEL OR SOLS (LABEL) ×2 IMPLANT
MANIFOLD NEPTUNE II (INSTRUMENTS) ×2 IMPLANT
NEEDLE HYPO 22GX1.5 SAFETY (NEEDLE) ×2 IMPLANT
NEEDLE INSUFFLATION 14GA 120MM (NEEDLE) ×2 IMPLANT
NS IRRIG 500ML POUR BTL (IV SOLUTION) ×2 IMPLANT
OBTURATOR OPTICAL STANDARD 8MM (TROCAR) ×1
OBTURATOR OPTICAL STND 8 DVNC (TROCAR) ×1
OBTURATOR OPTICALSTD 8 DVNC (TROCAR) ×1 IMPLANT
PACK LAP CHOLECYSTECTOMY (MISCELLANEOUS) ×2 IMPLANT
POUCH SPECIMEN RETRIEVAL 10MM (ENDOMECHANICALS) ×2 IMPLANT
SEAL CANN UNIV 5-8 DVNC XI (MISCELLANEOUS) ×3 IMPLANT
SEAL XI 5MM-8MM UNIVERSAL (MISCELLANEOUS) ×3
SET TUBE SMOKE EVAC HIGH FLOW (TUBING) ×2 IMPLANT
SOLUTION ELECTROLUBE (MISCELLANEOUS) ×2 IMPLANT
STAPLER CANNULA SEAL DVNC XI (STAPLE) ×1 IMPLANT
STAPLER CANNULA SEAL XI (STAPLE) ×1
SUT MNCRL 4-0 (SUTURE) ×2
SUT MNCRL 4-0 27XMFL (SUTURE) ×1
SUT MNCRL AB 4-0 PS2 18 (SUTURE) ×4 IMPLANT
SUT VICRYL 0 AB UR-6 (SUTURE) ×2 IMPLANT
SUTURE MNCRL 4-0 27XMF (SUTURE) ×1 IMPLANT

## 2020-11-30 NOTE — Anesthesia Procedure Notes (Signed)
Procedure Name: Intubation Date/Time: 11/30/2020 4:37 PM Performed by: Lerry Liner, CRNA Pre-anesthesia Checklist: Patient identified, Emergency Drugs available, Suction available and Patient being monitored Patient Re-evaluated:Patient Re-evaluated prior to induction Oxygen Delivery Method: Circle system utilized Preoxygenation: Pre-oxygenation with 100% oxygen Induction Type: IV induction Ventilation: Mask ventilation without difficulty Laryngoscope Size: McGraph and 3 Grade View: Grade I Tube type: Oral Tube size: 7.0 mm Number of attempts: 1 Airway Equipment and Method: Stylet and Oral airway Placement Confirmation: ETT inserted through vocal cords under direct vision, positive ETCO2 and breath sounds checked- equal and bilateral Secured at: 22 cm Tube secured with: Tape Dental Injury: Teeth and Oropharynx as per pre-operative assessment

## 2020-11-30 NOTE — Progress Notes (Signed)
Discharge instructions reviewed with patient and patient's husband Lennette Bihari, both verbalize understanding. Husband picking up pain medications for home.
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
## 2020-11-30 NOTE — Op Note (Signed)
Preoperative diagnosis: Cholelithiasis  Postoperative diagnosis: Cholelithiasis  Procedure: Robotic Assisted Laparoscopic Cholecystectomy.   Anesthesia: GETA   Surgeon: Dr. Windell Moment  Wound Classification: Clean Contaminated  Indications: Patient is a 55 y.o. female developed right upper quadrant pain, nausea and on workup was found to have cholelithiasis with a normal common duct. Robotic Assisted Laparoscopic cholecystectomy was elected.  Findings:  Critical view of safety achieved   Cystic duct and artery identified, ligated and divided Adequate hemostasis  Description of procedure: The patient was placed on the operating table in the supine position. General anesthesia was induced. A time-out was completed verifying correct patient, procedure, site, positioning, and implant(s) and/or special equipment prior to beginning this procedure. An orogastric tube was placed. The abdomen was prepped and draped in the usual sterile fashion.  An incision was made in a natural skin line below the umbilicus.  The fascia was elevated and the Veress needle inserted. Proper position was confirmed by aspiration and saline meniscus test.  The abdomen was insufflated with carbon dioxide to a pressure of 15 mmHg. The patient tolerated insufflation well. A 8-mm trocar was then inserted in optiview fashion.  The laparoscope was inserted and the abdomen inspected. No injuries from initial trocar placement were noted. Additional trocars were then inserted in the following locations: an 8-mm trocar in the left lateral abdomen, and another two 8-mm trocars to the right side of the abdomen 5 cm appart. The umbilical trocar was changed to a 12 mm trocar all under direct visualization. The abdomen was inspected and no abnormalities were found. The table was placed in the reverse Trendelenburg position with the right side up. The robotic arms were docked and target anatomy identified. Instrument inserted under  direct visualization.  Filmy adhesions between the gallbladder and omentum, duodenum and transverse colon were lysed with electrocautery. The dome of the gallbladder was grasped with a prograsp and retracted over the dome of the liver. The infundibulum was also grasped with an atraumatic grasper and retracted toward the right lower quadrant. This maneuver exposed Calot's triangle. The peritoneum overlying the gallbladder infundibulum was then incised and the cystic duct and cystic artery identified and circumferentially dissected. Critical view of safety reviewed before ligating any structure. Firefly images taken to visualize biliary ducts. The cystic duct and cystic artery were then doubly clipped and divided close to the gallbladder.  The gallbladder was then dissected from its peritoneal attachments by electrocautery. Hemostasis was checked and the gallbladder and contained stones were removed using an endoscopic retrieval bag. The gallbladder was passed off the table as a specimen. There was no evidence of bleeding from the gallbladder fossa or cystic artery or leakage of the bile from the cystic duct stump. Secondary trocars were removed under direct vision. No bleeding was noted. The robotic arms were undoked. The scope was withdrawn and the umbilical trocar removed. The abdomen was allowed to collapse. The fascia of the 87mm trocar sites was closed with figure-of-eight 0 vicryl sutures. The skin was closed with subcuticular sutures of 4-0 monocryl and topical skin adhesive. The orogastric tube was removed.  The patient tolerated the procedure well and was taken to the postanesthesia care unit in stable condition.   Specimen: Gallbladder  Complications: None  EBL: 10 mL

## 2020-11-30 NOTE — Discharge Instructions (Addendum)
  Diet: Resume home heart healthy regular diet.   Activity: No heavy lifting >20 pounds (children, pets, laundry, garbage) or strenuous activity until follow-up, but light activity and walking are encouraged. Do not drive or drink alcohol if taking narcotic pain medications.  Wound care: May shower with soapy water and pat dry (do not rub incisions), but no baths or submerging incision underwater until follow-up. (no swimming)   Medications: Resume all home medications. For mild to moderate pain: acetaminophen (Tylenol) ***or ibuprofen (if no kidney disease). Combining Tylenol with alcohol can substantially increase your risk of causing liver disease. Narcotic pain medications, if prescribed, can be used for severe pain, though may cause nausea, constipation, and drowsiness. Do not combine Tylenol and Norco within a 6 hour period as Norco contains Tylenol. If you do not need the narcotic pain medication, you do not need to fill the prescription.  Call office ([phone]) at any time if any questions, worsening pain, fevers/chills, bleeding, drainage from incision site, or other concerns.   AMBULATORY SURGERY  DISCHARGE INSTRUCTIONS   The drugs that you were given will stay in your system until tomorrow so for the next 24 hours you should not:  Drive an automobile Make any legal decisions Drink any alcoholic beverage   You may resume regular meals tomorrow.  Today it is better to start with liquids and gradually work up to solid foods.  You may eat anything you prefer, but it is better to start with liquids, then soup and crackers, and gradually work up to solid foods.   Please notify your doctor immediately if you have any unusual bleeding, trouble breathing, redness and pain at the surgery site, drainage, fever, or pain not relieved by medication.    Additional Instructions:        Please contact your physician with any problems or Same Day Surgery at [phone], Monday  through Friday 6 am to 4 pm, or [place] [facility] number at [phone].  
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
## 2020-11-30 NOTE — Transfer of Care (Signed)
Immediate Anesthesia Transfer of Care Note  Patient: Dana Stevens  Procedure(s) Performed: XI ROBOTIC ASSISTED LAPAROSCOPIC CHOLECYSTECTOMY (Abdomen)  Patient Location: PACU  Anesthesia Type:General  Level of Consciousness: drowsy  Airway & Oxygen Therapy: Patient Spontanous Breathing and Patient connected to face mask oxygen  Post-op Assessment: Report given to RN  Post vital signs: stable  Last Vitals:  Vitals Value Taken Time  BP 122/60 11/30/20 1751  Temp    Pulse 79 11/30/20 1754  Resp 27 11/30/20 1754  SpO2 100 % 11/30/20 1754  Vitals shown include unvalidated device data.  Last Pain:  Vitals:   11/30/20 1604  TempSrc: Tympanic  PainSc: 6          Complications: No notable events documented.
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
## 2020-12-01 NOTE — Anesthesia Postprocedure Evaluation (Signed)
Anesthesia Post Note  Patient: Dana Stevens  Procedure(s) Performed: XI ROBOTIC ASSISTED LAPAROSCOPIC CHOLECYSTECTOMY (Abdomen)  Patient location during evaluation: PACU Anesthesia Type: General Level of consciousness: awake and alert Pain management: pain level controlled Vital Signs Assessment: post-procedure vital signs reviewed and stable Respiratory status: spontaneous breathing, nonlabored ventilation, respiratory function stable and patient connected to nasal cannula oxygen Cardiovascular status: blood pressure returned to baseline and stable Postop Assessment: no apparent nausea or vomiting Anesthetic complications: no   No notable events documented.   Last Vitals:  Vitals:   11/30/20 1830 11/30/20 1845  BP: 112/61 (!) 102/54  Pulse: (!) 58 (!) 50  Resp: 17 14  Temp: (!) 36.3 C (!) 36.3 C  SpO2: 97% 97%    Last Pain:  Vitals:   11/30/20 1845  TempSrc:   PainSc: 4                  Martha Clan

## 2020-12-02 LAB — SURGICAL PATHOLOGY

## 2021-03-24 ENCOUNTER — Emergency Department: Payer: 59

## 2021-03-24 ENCOUNTER — Emergency Department
Admission: EM | Admit: 2021-03-24 | Discharge: 2021-03-24 | Disposition: A | Discharge status: Home / Self Care | Payer: 59 | Attending: Emergency Medicine | Admitting: Emergency Medicine

## 2021-03-24 ENCOUNTER — Other Ambulatory Visit: Payer: Self-pay

## 2021-03-24 DIAGNOSIS — Z79899 Other long term (current) drug therapy: Secondary | ICD-10-CM | POA: Insufficient documentation

## 2021-03-24 DIAGNOSIS — M542 Cervicalgia: Secondary | ICD-10-CM | POA: Diagnosis not present

## 2021-03-24 DIAGNOSIS — Z87891 Personal history of nicotine dependence: Secondary | ICD-10-CM | POA: Diagnosis not present

## 2021-03-24 DIAGNOSIS — M25512 Pain in left shoulder: Secondary | ICD-10-CM | POA: Insufficient documentation

## 2021-03-24 DIAGNOSIS — I10 Essential (primary) hypertension: Secondary | ICD-10-CM | POA: Diagnosis not present

## 2021-03-24 DIAGNOSIS — Z91040 Latex allergy status: Secondary | ICD-10-CM | POA: Diagnosis not present

## 2021-03-24 DIAGNOSIS — R55 Syncope and collapse: Secondary | ICD-10-CM | POA: Insufficient documentation

## 2021-03-24 LAB — BASIC METABOLIC PANEL
Anion gap: 11 (ref 5–15)
BUN: 32 mg/dL — ABNORMAL HIGH (ref 6–20)
CO2: 25 mmol/L (ref 22–32)
Calcium: 9.4 mg/dL (ref 8.9–10.3)
Chloride: 101 mmol/L (ref 98–111)
Creatinine, Ser: 0.74 mg/dL (ref 0.44–1.00)
GFR, Estimated: >60 mL/min (ref >60)
Glucose, Bld: 88 mg/dL (ref 70–99)
Potassium: 3.5 mmol/L (ref 3.5–5.1)
Sodium: 137 mmol/L (ref 135–145)

## 2021-03-24 LAB — URINALYSIS, COMPLETE (UACMP) WITH MICROSCOPIC
Bacteria, UA: NONE SEEN
Bilirubin Urine: NEGATIVE
Glucose, UA: NEGATIVE mg/dL
Hgb urine dipstick: NEGATIVE
Ketones, ur: NEGATIVE mg/dL
Nitrite: NEGATIVE
Protein, ur: NEGATIVE mg/dL
Specific Gravity, Urine: 1.019 (ref 1.005–1.030)
pH: 6 (ref 5.0–8.0)

## 2021-03-24 LAB — CBC
HCT: 41.6 % (ref 36.0–46.0)
Hemoglobin: 14.2 g/dL (ref 12.0–15.0)
MCH: 30.7 pg (ref 26.0–34.0)
MCHC: 34.1 g/dL (ref 30.0–36.0)
MCV: 90 fL (ref 80.0–100.0)
Platelets: 322 10*3/uL (ref 150–400)
RBC: 4.62 MIL/uL (ref 3.87–5.11)
RDW: 12 % (ref 11.5–15.5)
WBC: 8.3 10*3/uL (ref 4.0–10.5)
nRBC: 0 % (ref 0.0–0.2)

## 2021-03-24 MED ORDER — OXYCODONE-ACETAMINOPHEN 5-325 MG PO TABS: 1.0000 | ORAL_TABLET | ORAL | 0 refills | Status: AC | PRN

## 2021-03-24 MED ORDER — OXYCODONE-ACETAMINOPHEN 5-325 MG PO TABS
1.0000 | ORAL_TABLET | Freq: Once | ORAL | Status: AC
  Administered 2021-03-24: 1 via ORAL
  Filled 2021-03-24: qty 1

## 2021-03-24 MED ORDER — LIDOCAINE 5 % EX PTCH
1.0000 | MEDICATED_PATCH | CUTANEOUS | Status: DC
  Administered 2021-03-24: 1 via TRANSDERMAL
  Filled 2021-03-24: qty 1
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
## 2021-03-24 NOTE — ED Triage Notes (Signed)
See first nurse note- pt to ER after syncopal episode at work this afternoon. Reports landing on her head and left arm. Reports neck, shoulder, collar bone, and elbow pain on the left side. At this time, patient reports feeling lightheaded.   Denies blood thinner usage.

## 2021-03-24 NOTE — ED Notes (Addendum)
RN to bedside. Pt is CAOx4. No acute distress. Pt is having left shoulder pain and pain in her neck from the fall today. Pain at 7/10 with no improvement.  Pt is obviously upset. Pt states "I am hungry, thirsty and frustrated that I have been here for 4 hours and not been seen". This RN advised that we are a very busy ER and I apologized for her wait.
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
## 2021-03-31 ENCOUNTER — Other Ambulatory Visit: Payer: Self-pay | Admitting: Sports Medicine

## 2021-03-31 DIAGNOSIS — W19XXXA Unspecified fall, initial encounter: Secondary | ICD-10-CM

## 2021-03-31 DIAGNOSIS — M7542 Impingement syndrome of left shoulder: Secondary | ICD-10-CM

## 2021-03-31 DIAGNOSIS — M19012 Primary osteoarthritis, left shoulder: Secondary | ICD-10-CM

## 2021-03-31 DIAGNOSIS — M25512 Pain in left shoulder: Secondary | ICD-10-CM

## 2021-04-06 ENCOUNTER — Other Ambulatory Visit: Payer: Self-pay

## 2021-04-06 ENCOUNTER — Ambulatory Visit
Admission: RE | Admit: 2021-04-06 | Discharge: 2021-04-06 | Disposition: A | Discharge status: Home / Self Care | Payer: 59 | Source: Ambulatory Visit | Attending: Sports Medicine | Admitting: Sports Medicine

## 2021-04-06 DIAGNOSIS — M19012 Primary osteoarthritis, left shoulder: Secondary | ICD-10-CM | POA: Diagnosis not present

## 2021-04-06 DIAGNOSIS — M7542 Impingement syndrome of left shoulder: Secondary | ICD-10-CM | POA: Insufficient documentation

## 2021-04-06 DIAGNOSIS — W19XXXA Unspecified fall, initial encounter: Secondary | ICD-10-CM | POA: Diagnosis not present

## 2021-04-06 DIAGNOSIS — M25512 Pain in left shoulder: Secondary | ICD-10-CM

## 2021-05-20 IMAGING — CT CT ABD-PELV W/ CM
2 of 5 series · 11 of 46 positions shown, 12 images · IV contrast (omnipaque)
Comparison: CT 02/09/2020
COMPARISON: CT 02/09/2020

Addendum:
CLINICAL DATA: Persistent pelvic pain, postop cyst removal
02/11/2020

EXAM:
CT ABDOMEN AND PELVIS WITH CONTRAST
TECHNIQUE: Multidetector CT imaging of the abdomen and pelvis was performed
using the standard protocol following bolus administration of
intravenous contrast.
CONTRAST:  150mL OMNIPAQUE IOHEXOL 300 MG/ML  SOLN

[Series 2: routine abd/pel with · axial · 0.98mm/px · z∈[-442,-22]mm · 8 of 98 slices shown, 9 images]
[im 7/98  soft-tissue]
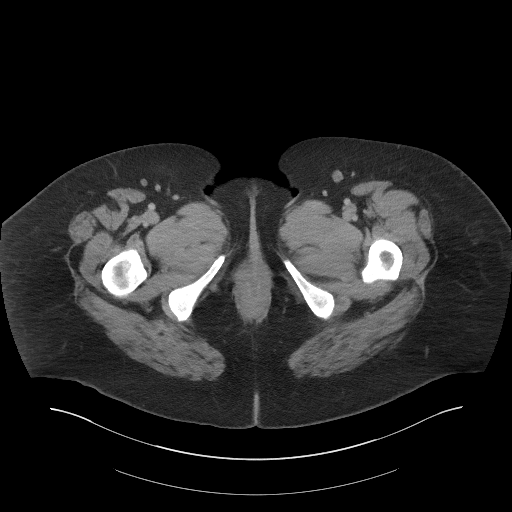
[im 7/98  bone]
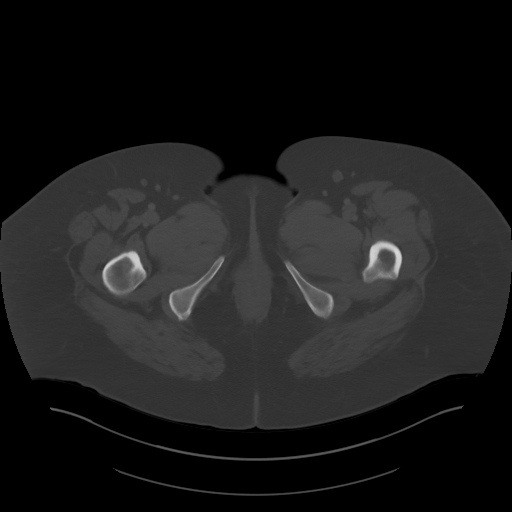
[im 21/98  soft-tissue]
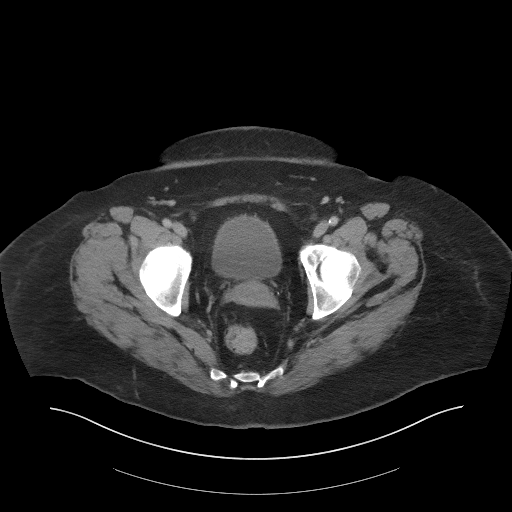
[im 35/98  soft-tissue]
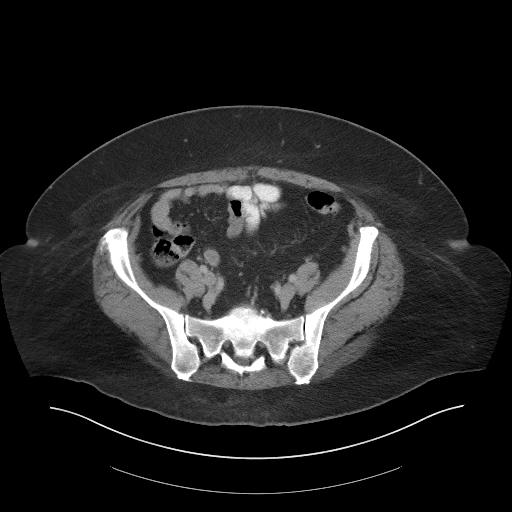
[im 42/98  soft-tissue]
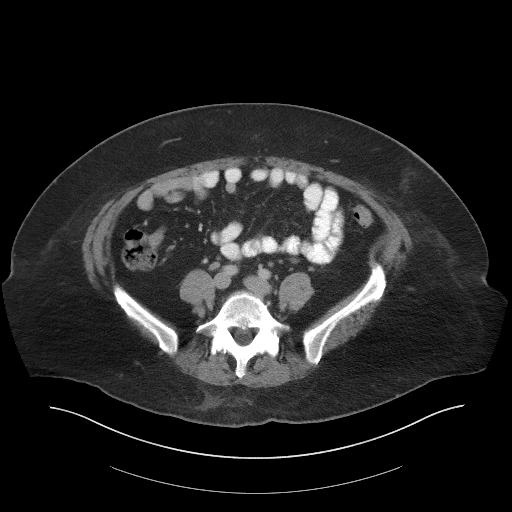
[im 56/98  soft-tissue]
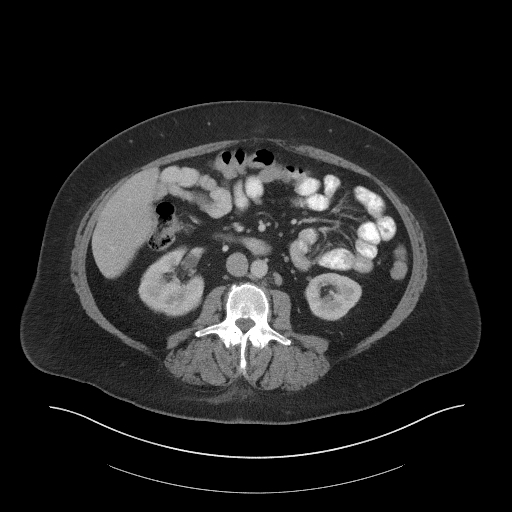
[im 63/98  soft-tissue]
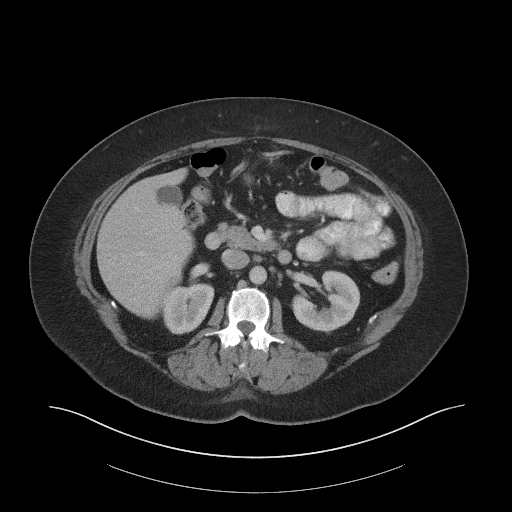
[im 77/98  soft-tissue]
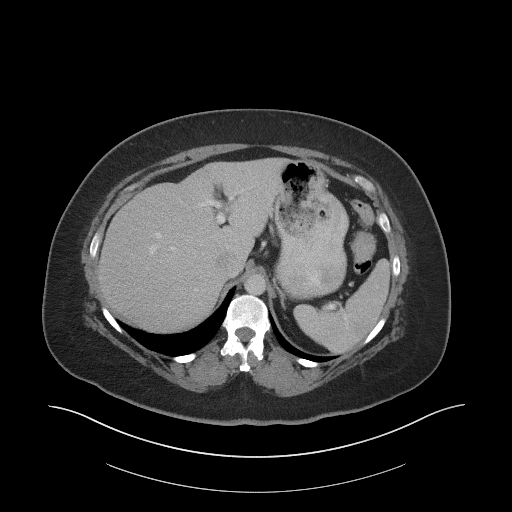
[im 91/98  soft-tissue]
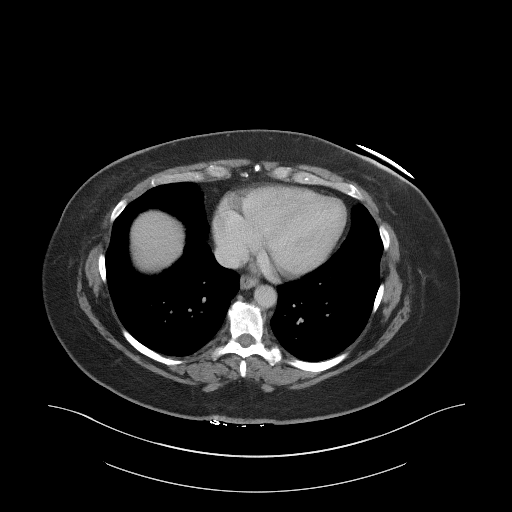

[Series 5: coronal st · coronal · 0.96mm/px · 3 of 101 slices shown]
[im 34/101  soft-tissue]
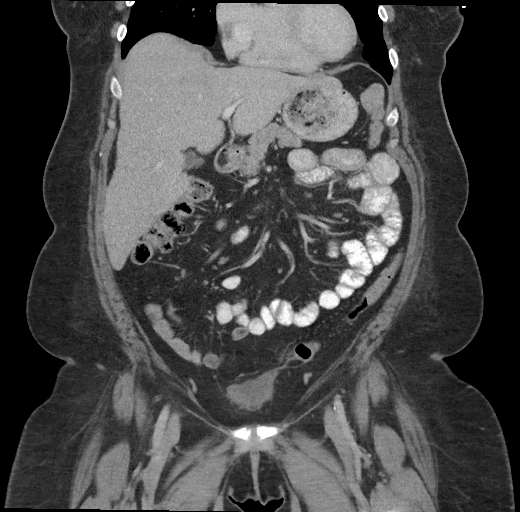
[im 45/101  soft-tissue]
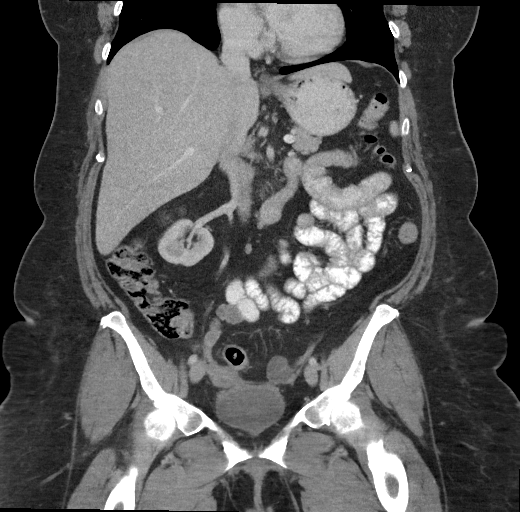
[im 56/101  soft-tissue]
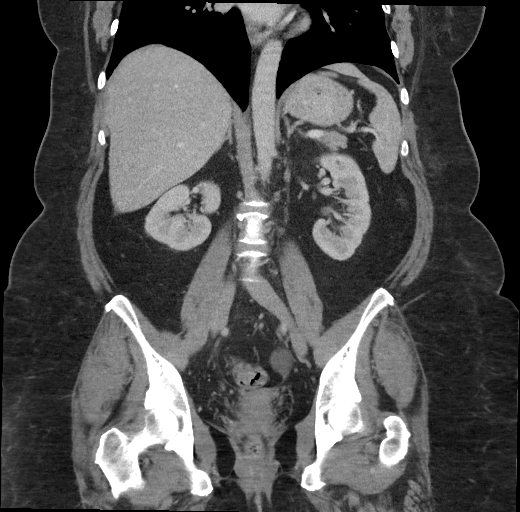

[11 of 46 positions shown; findings below may reference images not displayed]

FINDINGS: Lower chest: Lung bases are clear. Normal heart size. No pericardial
effusion.

Hepatobiliary: Small bilobed 1.5 cm hypoattenuating focus in the
junction of segment 8 and 4 of the liver ([DATE]), not significantly
changed from prior, favoring benignity. Additional subcentimeter
hypoattenuating focus in the posterior right lobe periphery (2/33)
also unchanged and too small to fully characterize on CT imaging but
statistically likely benign. No new or concerning liver lesions.
Normal gallbladder and biliary tree without visible calcified
gallstone, pericholecystic inflammation or ductal dilatation.

Pancreas: Unremarkable. No pancreatic ductal dilatation or
surrounding inflammatory changes.

Spleen: Normal in size. No concerning splenic lesions.

Adrenals/Urinary Tract: Normal adrenal glands. Kidneys enhance and
excrete symmetrically. Tiny subcentimeter hypoattenuating foci in
the right kidney (5/48, 47) are also incompletely characterized on
CT imaging due to the diminutive size though are unchanged from
comparison and likely benign. No concerning renal lesions. No
urolithiasis or hydronephrosis. Circumferential bladder wall
thickening is noted with intraluminal gas noted within the bladder.
Faint perivesicular hazy stranding is noted as well. Some asymmetric
thickening towards the bladder dome is contiguous with a
thick-walled air and fluid containing collection extending into the
right adnexa, further detailed below.

Stomach/Bowel: Distal esophagus, stomach and duodenal sweep are
unremarkable. No small bowel wall thickening or dilatation. No
evidence of obstruction. A normal appendix is visualized. No colonic
dilatation or wall thickening.

Vascular/Lymphatic: Atherosclerotic calcifications within the
abdominal aorta and branch vessels. No aneurysm or ectasia. No
enlarged abdominopelvic lymph nodes.

Reproductive: Prior hysterectomy. Postsurgical changes from recent
right ovarian cyst removal with lysis of adhesion. There is a
heterogeneous air and fluid containing thick-walled collection along
the right adnexa and contiguous with the dome of the bladder
measuring approximately 4.1 x 2.3 x 1.9 cm in size. Collection is
indeterminate though cannot exclude a developing abscess in the
setting of persistent pelvic pain postop day 9 from the surgical
procedure. Few normal follicle seen in the left ovary.

Other: Air and fluid containing collection in the right adnexa
adherent to the dome of the bladder, as detailed above. No
abdominopelvic free air. No other concerning collections are seen.
Postoperative changes of the abdominal wall are noted. No bowel
containing hernias. Small fat containing umbilical hernia.

Musculoskeletal: No acute osseous abnormality or suspicious osseous
lesion. Multilevel degenerative changes are present in the imaged
portions of the spine. Additional mild degenerative changes in the
hips and pelvis.
IMPRESSION: 1. Postsurgical changes from recent laparoscopic right ovarian
cystectomy. There is a heterogeneous air and fluid containing
thick-walled collection along the right adnexa and contiguous with
the dome of the bladder measuring approximately 4.1 x 2.3 x 1.9 cm
in size. Collection is indeterminate though cannot exclude a
developing abscess in the setting of persistent pelvic pain postop
day 9 from the surgical procedure.
2. Circumferential bladder wall thickening with intraluminal gas
noted within the bladder as well as more focal thickening towards
the bladder dome where there is loss of fat plane between the
bladder serosa and adjacent heterogeneous collection. While possibly
related to a cystitis or reactive change, developing fistula cannot
be fully excluded. Correlate with urinalysis results and patient's
symptoms. If there is clinical concern for potential fistulous less
bladder injury, cystography or direct visualization could be
performed.
3. Aortic Atherosclerosis (NFDSS-LF0.0).

These results will be called to the ordering clinician or
representative by the Radiologist Assistant, and communication
documented in the PACS or [REDACTED].

ADDENDUM:
Case was discussed with the ordering and operative provider who
indicated that hemostatic agents were placed in the region of the
complex lysis of adhesions in the right lower quadrant. The
placement of this material could feasibly account for the foci of
gas within the mildly heterogenous collection along the dome of the
bladder. Recommend close clinical surveillance of the patient,
correlation with urinalysis to exclude cystitis, and pursuit of
further imaging studies as clinically warranted.

Few foci of punctate gas were identified on review of the images
adjacent the surgical site anterior to the uterus, nonspecific given
recent procedure.

These results were called by telephone at the time of interpretation
on 02/20/2020 at [DATE] to provider JAN-ERIK VELANDER , who verbally
acknowledged these results.

*** End of Addendum ***
FINDINGS: Lower chest: Lung bases are clear. Normal heart size. No pericardial
effusion.

Hepatobiliary: Small bilobed 1.5 cm hypoattenuating focus in the
junction of segment 8 and 4 of the liver ([DATE]), not significantly
changed from prior, favoring benignity. Additional subcentimeter
hypoattenuating focus in the posterior right lobe periphery (2/33)
also unchanged and too small to fully characterize on CT imaging but
statistically likely benign. No new or concerning liver lesions.
Normal gallbladder and biliary tree without visible calcified
gallstone, pericholecystic inflammation or ductal dilatation.

Pancreas: Unremarkable. No pancreatic ductal dilatation or
surrounding inflammatory changes.

Spleen: Normal in size. No concerning splenic lesions.

Adrenals/Urinary Tract: Normal adrenal glands. Kidneys enhance and
excrete symmetrically. Tiny subcentimeter hypoattenuating foci in
the right kidney (5/48, 47) are also incompletely characterized on
CT imaging due to the diminutive size though are unchanged from
comparison and likely benign. No concerning renal lesions. No
urolithiasis or hydronephrosis. Circumferential bladder wall
thickening is noted with intraluminal gas noted within the bladder.
Faint perivesicular hazy stranding is noted as well. Some asymmetric
thickening towards the bladder dome is contiguous with a
thick-walled air and fluid containing collection extending into the
right adnexa, further detailed below.

Stomach/Bowel: Distal esophagus, stomach and duodenal sweep are
unremarkable. No small bowel wall thickening or dilatation. No
evidence of obstruction. A normal appendix is visualized. No colonic
dilatation or wall thickening.

Vascular/Lymphatic: Atherosclerotic calcifications within the
abdominal aorta and branch vessels. No aneurysm or ectasia. No
enlarged abdominopelvic lymph nodes.

Reproductive: Prior hysterectomy. Postsurgical changes from recent
right ovarian cyst removal with lysis of adhesion. There is a
heterogeneous air and fluid containing thick-walled collection along
the right adnexa and contiguous with the dome of the bladder
measuring approximately 4.1 x 2.3 x 1.9 cm in size. Collection is
indeterminate though cannot exclude a developing abscess in the
setting of persistent pelvic pain postop day 9 from the surgical
procedure. Few normal follicle seen in the left ovary.

Other: Air and fluid containing collection in the right adnexa
adherent to the dome of the bladder, as detailed above. No
abdominopelvic free air. No other concerning collections are seen.
Postoperative changes of the abdominal wall are noted. No bowel
containing hernias. Small fat containing umbilical hernia.

Musculoskeletal: No acute osseous abnormality or suspicious osseous
lesion. Multilevel degenerative changes are present in the imaged
portions of the spine. Additional mild degenerative changes in the
hips and pelvis.
IMPRESSION: 1. Postsurgical changes from recent laparoscopic right ovarian
cystectomy. There is a heterogeneous air and fluid containing
thick-walled collection along the right adnexa and contiguous with
the dome of the bladder measuring approximately 4.1 x 2.3 x 1.9 cm
in size. Collection is indeterminate though cannot exclude a
developing abscess in the setting of persistent pelvic pain postop
day 9 from the surgical procedure.
2. Circumferential bladder wall thickening with intraluminal gas
noted within the bladder as well as more focal thickening towards
the bladder dome where there is loss of fat plane between the
bladder serosa and adjacent heterogeneous collection. While possibly
related to a cystitis or reactive change, developing fistula cannot
be fully excluded. Correlate with urinalysis results and patient's
symptoms. If there is clinical concern for potential fistulous less
bladder injury, cystography or direct visualization could be
performed.
3. Aortic Atherosclerosis (NFDSS-LF0.0).

These results will be called to the ordering clinician or
representative by the Radiologist Assistant, and communication
documented in the PACS or [REDACTED].

## 2021-11-24 DIAGNOSIS — R69 Illness, unspecified: Secondary | ICD-10-CM | POA: Diagnosis not present

## 2021-11-24 DIAGNOSIS — Z0000 Encounter for general adult medical examination without abnormal findings: Secondary | ICD-10-CM | POA: Diagnosis not present

## 2021-11-24 DIAGNOSIS — I10 Essential (primary) hypertension: Secondary | ICD-10-CM | POA: Diagnosis not present

## 2021-11-24 DIAGNOSIS — R519 Headache, unspecified: Secondary | ICD-10-CM | POA: Diagnosis not present

## 2021-11-24 DIAGNOSIS — M533 Sacrococcygeal disorders, not elsewhere classified: Secondary | ICD-10-CM | POA: Diagnosis not present

## 2021-11-24 DIAGNOSIS — Z23 Encounter for immunization: Secondary | ICD-10-CM | POA: Diagnosis not present

## 2021-11-24 DIAGNOSIS — G4700 Insomnia, unspecified: Secondary | ICD-10-CM | POA: Diagnosis not present

## 2022-01-16 DIAGNOSIS — M2550 Pain in unspecified joint: Secondary | ICD-10-CM | POA: Diagnosis not present

## 2022-01-16 DIAGNOSIS — M791 Myalgia, unspecified site: Secondary | ICD-10-CM | POA: Diagnosis not present

## 2022-01-16 DIAGNOSIS — G43909 Migraine, unspecified, not intractable, without status migrainosus: Secondary | ICD-10-CM | POA: Diagnosis not present

## 2022-01-17 DIAGNOSIS — R5381 Other malaise: Secondary | ICD-10-CM | POA: Diagnosis not present

## 2022-01-17 DIAGNOSIS — R5383 Other fatigue: Secondary | ICD-10-CM | POA: Diagnosis not present

## 2022-01-17 DIAGNOSIS — M2550 Pain in unspecified joint: Secondary | ICD-10-CM | POA: Diagnosis not present

## 2022-01-17 DIAGNOSIS — M797 Fibromyalgia: Secondary | ICD-10-CM | POA: Diagnosis not present

## 2022-01-17 DIAGNOSIS — R6889 Other general symptoms and signs: Secondary | ICD-10-CM | POA: Diagnosis not present
